# Patient Record
Sex: Male | Born: 1965 | Race: White | Hispanic: No | Marital: Married | Smoking: Former smoker
Health system: Southern US, Community
[De-identification: ages and names within clinical notes are randomized; demographics above are authoritative.]

## PROBLEM LIST (undated history)

## (undated) DIAGNOSIS — R0600 Dyspnea, unspecified: Secondary | ICD-10-CM

## (undated) DIAGNOSIS — E119 Type 2 diabetes mellitus without complications: Secondary | ICD-10-CM

## (undated) DIAGNOSIS — G4730 Sleep apnea, unspecified: Secondary | ICD-10-CM

## (undated) HISTORY — PX: CHOLECYSTECTOMY: SHX55

---

## 2005-11-22 ENCOUNTER — Ambulatory Visit: Payer: Self-pay | Admitting: Family Medicine

## 2006-03-28 ENCOUNTER — Ambulatory Visit: Payer: Self-pay | Admitting: Family Medicine

## 2006-08-14 ENCOUNTER — Ambulatory Visit: Payer: Self-pay | Admitting: Family Medicine

## 2006-08-15 ENCOUNTER — Ambulatory Visit: Payer: Self-pay | Admitting: Family Medicine

## 2006-08-20 ENCOUNTER — Ambulatory Visit: Payer: Self-pay | Admitting: Family Medicine

## 2006-09-05 ENCOUNTER — Encounter: Admission: RE | Admit: 2006-09-05 | Discharge: 2006-09-05 | Payer: Self-pay | Admitting: Surgery

## 2006-09-14 ENCOUNTER — Ambulatory Visit (HOSPITAL_COMMUNITY): Admission: RE | Admit: 2006-09-14 | Discharge: 2006-09-14 | Payer: Self-pay | Admitting: Surgery

## 2006-09-21 ENCOUNTER — Encounter: Admission: RE | Admit: 2006-09-21 | Discharge: 2006-09-21 | Payer: Self-pay | Admitting: Surgery

## 2007-06-17 ENCOUNTER — Ambulatory Visit: Payer: Self-pay | Admitting: Family Medicine

## 2007-06-17 DIAGNOSIS — R1031 Right lower quadrant pain: Secondary | ICD-10-CM

## 2007-06-18 ENCOUNTER — Encounter (HOSPITAL_COMMUNITY): Admission: RE | Admit: 2007-06-18 | Discharge: 2007-07-02 | Payer: Self-pay | Admitting: General Surgery

## 2007-06-21 ENCOUNTER — Encounter (INDEPENDENT_AMBULATORY_CARE_PROVIDER_SITE_OTHER): Payer: Self-pay | Admitting: General Surgery

## 2007-06-21 ENCOUNTER — Ambulatory Visit (HOSPITAL_COMMUNITY): Admission: RE | Admit: 2007-06-21 | Discharge: 2007-06-21 | Payer: Self-pay | Admitting: General Surgery

## 2009-11-11 ENCOUNTER — Telehealth: Payer: Self-pay | Admitting: Family Medicine

## 2010-11-01 NOTE — Progress Notes (Signed)
Summary: pt walked in  Phone Note Call from Patient   Caller: Patient Summary of Call: Pt walked in complaing of feeling dizzy and light headed since this morning.  He said he has had a flu like virus all week.  I checked with Dr Hetty Ely, he was not able to see him. No appt available with Dr Dayton Martes but offered to work pt in at 2:15.  He said that was too long to wait, he  and his wife left. Initial call taken by: Lowella Petties CMA,  November 11, 2009 9:16 AM  Follow-up for Phone Call        I could work him in at 10:30, 11:15 or11:30 Follow-up by: Ruthe Mannan MD,  November 11, 2009 9:27 AM  Additional Follow-up for Phone Call Additional follow up Details #1::        [facility] asking pt to call.                Lowella Petties CMA  November 11, 2009 9:32 AM

## 2011-02-14 NOTE — H&P (Signed)
NAMEDEWARREN, LEDBETTER              ACCOUNT NO.:  [id number]   MEDICAL RECORD NO.:  [id number]          PATIENT TYPE:  AMB   LOCATION:  DAY                           FACILITY:  [facility]   PHYSICIAN:  Dalia Heading, M.D.  DATE OF BIRTH:  01/21/1966   DATE OF ADMISSION:  DATE OF DISCHARGE:  LH                              HISTORY & PHYSICAL   CHIEF COMPLAINT:  Chronic cholecystitis.   HISTORY OF PRESENT ILLNESS:  The patient is a 45 year old white male who  is referred for evaluation and treatment of biliary colic secondary to  chronic cholecystitis.  He has been having right upper quadrant  abdominal pain, nausea, bloating for many years.  He does have fatty  food intolerance.  No fever, chills, jaundice have been noted.   PAST MEDICAL HISTORY:  Unremarkable.   PAST SURGICAL HISTORY:  Unremarkable.   CURRENT MEDICATIONS:  None.   ALLERGIES:  No known drug allergies.   REVIEW OF SYSTEMS:  Noncontributory.   PHYSICAL EXAMINATION:  GENERAL:  The patient is a well-developed, well-  nourished white male in no acute distress.  HEENT:  Reveals no scleral icterus.  LUNGS:  Clear to auscultation with equal breath sounds bilaterally.  HEART:  Reveals regular rate and rhythm without S3, S4, murmurs.  ABDOMEN:  Soft, nondistended.  He is tender in the right upper quadrant  to palpation.  No hepatosplenomegaly or hernias are identified.   Ultrasound of the gallbladder in the past has been negative for  cholelithiasis.  HIDA scan reveals chronic cholecystitis with a very low  gallbladder ejection fraction.   IMPRESSION:  Chronic cholecystitis.   PLAN:  The patient is scheduled for laparoscopic cholecystectomy on  June 21, 2007.  The risks and benefits of the procedure including  bleeding, infection, hepatobiliary injury and the possibility of an open  procedure were fully explained to the patient who gave informed consent.      Dalia Heading, M.D.  Electronically  Signed     MAJ/MEDQ  D:  06/18/2007  T:  06/19/2007  Job:  [id number]

## 2011-02-14 NOTE — Op Note (Signed)
NAMESAYRE, Gregory Johnston              ACCOUNT NO.:  [id number]   MEDICAL RECORD NO.:  [id number]          PATIENT TYPE:  AMB   LOCATION:  DAY                           FACILITY:  [facility]   PHYSICIAN:  Dalia Heading, M.D.  DATE OF BIRTH:  1966-02-02   DATE OF PROCEDURE:  06/21/2007  DATE OF DISCHARGE:                               OPERATIVE REPORT   PREOPERATIVE DIAGNOSIS:  Chronic cholecystitis.   POSTOPERATIVE DIAGNOSIS:  Chronic cholecystitis.   PROCEDURE:  Laparoscopic cholecystectomy.   SURGEON:  Dr. Franky Macho.   ASSISTANT:  Dr. Tilford Pillar.   ANESTHESIA:  General endotracheal.   INDICATIONS:  The patient is a 45 year old white male who presents with  biliary colic secondary to chronic cholecystitis.  Risks and benefits of  the procedure including bleeding, infection, hepatobiliary injury and  the possibility an open procedure were fully explained to the patient,  who gave informed consent.   PROCEDURE NOTE:  The patient was placed in the supine position.  After  induction of general endotracheal anesthesia, the abdomen was prepped  and draped using the usual sterile technique with Betadine.  Surgical  site confirmation was performed.   A supraumbilical incision was made down to the fascia.  A Veress needle  was introduced into the abdominal cavity, and confirmation of placement  was done using the saline drop test.  The abdomen was then insufflated  to 16 mmHg pressure.  An 11-mm trocar was introduced into the abdominal  cavity under direct visualization without difficulty.  The patient was  placed in reversed Trendelenburg position.  Additional 11-mm trocar was  placed in the epigastric region, and 5-mm trocars were placed in the  right upper quadrant and right flank regions.  Liver was inspected and  noted to normal limits.  The gallbladder was retracted superiorly and  laterally.  Dissection was begun around the infundibulum of the  gallbladder.  The cystic duct  was first identified.  Its juncture to the  infundibulum fully identified.  Endoclips were placed proximally and  distally on the cystic duct, and the cystic duct was divided.  This was  likewise done to the cystic artery.  The gallbladder then freed away  from the gallbladder fossa using Bovie electrocautery.  The gallbladder  delivered through the epigastric trocar site using an EndoCatch bag.  The gallbladder fossa was inspected, and no abnormal bleeding or bile  leakage was noted.  Surgicel was placed in the gallbladder fossa.  All  fluid and air were then evacuated from the abdominal cavity prior to  removal of the trocars.   All wounds were irrigated with normal saline.  All wounds were injected  with 0.5% Sensorcaine.  The supraumbilical fascia was reapproximated  using a 0 Vicryl interrupted suture.  All skin incisions were closed  using staples.  Betadine ointment and dry sterile dressings were  applied.   All tape and needle counts were correct at the end of the procedure.  The patient was extubated in the operating room and went back to  recovery room awake in stable condition.   COMPLICATIONS:  None.  SPECIMEN:  Gallbladder.   BLOOD LOSS:  Minimal.      Dalia Heading, M.D.  Electronically Signed     MAJ/MEDQ  D:  06/21/2007  T:  06/21/2007  Job:  [id number]

## 2011-02-17 NOTE — Letter (Signed)
August 15, 2006    [place]. Daphine Deutscher, MD  [address]  [place], [place] [address]   RE:  Gregory, Johnston  MRN:  [id number]  /  DOB:  1966-08-09   Dear [name]:   This is to introduce Gregory Johnston, a 45 year old white male who came to me  with right posterior flank pain that had been coming and going prior to  being seen and was fairly constant.  When I saw him, he denied fevers or  chills.  He thought that greasy food was bothering him significantly,  especially salad with Ranch dressing.  He cannot get comfortable when it  happens.  It usually lasts 3-4 hours, more constant in the last few weeks.  He denies nausea and vomiting.  5/10 when I saw him, 7/10 at the worse, and  0/10 when it was not there.  He does not see blood in his urine.  Ultrasound  was negative in April 2004.  I sent him for an ultrasound this time which  was positive only for signs consistent for cholecystitis without stones  being seen.  This is an oral report, only.  His metabolic profile to include  a metabolic panel, CBC, amylase, and lipase, were all normal.   Past medical history is also significant for diabetes since march 2007.  He  is on no medications.  He has controlled it well with diet and exercise  since diagnosis.   He has no known drug allergies and is, otherwise, reasonably healthy.   I appreciate your seeing him and will look forward to your evaluation.    Sincerely,      Arta Silence, MD  Electronically Signed    RNS/MedQ  DD: 08/15/2006  DT: 08/15/2006  Job #: [id number]

## 2011-07-13 LAB — BASIC METABOLIC PANEL
BUN: 20
Calcium: 9.2
Chloride: 104
Creatinine, Ser: 1.27
Glucose, Bld: 96
Sodium: 137

## 2011-07-13 LAB — CBC
Hemoglobin: 14.5
MCV: 87

## 2011-07-13 LAB — HEPATIC FUNCTION PANEL
AST: 25
Indirect Bilirubin: 0.8

## 2013-05-16 ENCOUNTER — Ambulatory Visit: Payer: Self-pay | Admitting: Unknown Physician Specialty

## 2013-05-19 LAB — PATHOLOGY REPORT

## 2014-03-16 DIAGNOSIS — K219 Gastro-esophageal reflux disease without esophagitis: Secondary | ICD-10-CM | POA: Insufficient documentation

## 2014-03-16 DIAGNOSIS — M25561 Pain in right knee: Secondary | ICD-10-CM | POA: Insufficient documentation

## 2014-03-16 DIAGNOSIS — E119 Type 2 diabetes mellitus without complications: Secondary | ICD-10-CM | POA: Insufficient documentation

## 2014-11-27 ENCOUNTER — Ambulatory Visit: Payer: Self-pay | Admitting: Specialist

## 2015-03-17 DIAGNOSIS — G4733 Obstructive sleep apnea (adult) (pediatric): Secondary | ICD-10-CM | POA: Insufficient documentation

## 2018-05-11 ENCOUNTER — Emergency Department: Payer: BLUE CROSS/BLUE SHIELD

## 2018-05-11 ENCOUNTER — Encounter: Payer: Self-pay | Admitting: Emergency Medicine

## 2018-05-11 ENCOUNTER — Emergency Department
Admission: EM | Admit: 2018-05-11 | Discharge: 2018-05-11 | Disposition: A | Discharge status: Home / Self Care | Payer: BLUE CROSS/BLUE SHIELD | Attending: Emergency Medicine | Admitting: Emergency Medicine

## 2018-05-11 ENCOUNTER — Other Ambulatory Visit: Payer: Self-pay

## 2018-05-11 DIAGNOSIS — W458XXA Other foreign body or object entering through skin, initial encounter: Secondary | ICD-10-CM | POA: Diagnosis not present

## 2018-05-11 DIAGNOSIS — S80852A Superficial foreign body, left lower leg, initial encounter: Secondary | ICD-10-CM

## 2018-05-11 DIAGNOSIS — Z23 Encounter for immunization: Secondary | ICD-10-CM | POA: Insufficient documentation

## 2018-05-11 DIAGNOSIS — Y999 Unspecified external cause status: Secondary | ICD-10-CM | POA: Diagnosis not present

## 2018-05-11 DIAGNOSIS — S81842A Puncture wound with foreign body, left lower leg, initial encounter: Secondary | ICD-10-CM | POA: Insufficient documentation

## 2018-05-11 DIAGNOSIS — Z87891 Personal history of nicotine dependence: Secondary | ICD-10-CM | POA: Diagnosis not present

## 2018-05-11 DIAGNOSIS — Y93H2 Activity, gardening and landscaping: Secondary | ICD-10-CM | POA: Diagnosis not present

## 2018-05-11 DIAGNOSIS — Y92017 Garden or yard in single-family (private) house as the place of occurrence of the external cause: Secondary | ICD-10-CM | POA: Insufficient documentation

## 2018-05-11 MED ORDER — IBUPROFEN 600 MG PO TABS: 600.0000 mg | ORAL_TABLET | Freq: Three times a day (TID) | ORAL | 0 refills | Status: DC | PRN

## 2018-05-11 MED ORDER — CLINDAMYCIN PHOSPHATE 600 MG/50ML IV SOLN
600.0000 mg | Freq: Once | INTRAVENOUS | Status: AC
  Administered 2018-05-11: 600 mg via INTRAVENOUS
  Filled 2018-05-11: qty 50

## 2018-05-11 MED ORDER — CLINDAMYCIN HCL 300 MG PO CAPS: 300.0000 mg | ORAL_CAPSULE | Freq: Three times a day (TID) | ORAL | 0 refills | Status: AC

## 2018-05-11 MED ORDER — TETANUS-DIPHTH-ACELL PERTUSSIS 5-2.5-18.5 LF-MCG/0.5 IM SUSP
0.5000 mL | Freq: Once | INTRAMUSCULAR | Status: AC
  Administered 2018-05-11: 0.5 mL via INTRAMUSCULAR
  Filled 2018-05-11: qty 0.5

## 2018-05-11 MED ORDER — TRAMADOL HCL 50 MG PO TABS: 50.0000 mg | ORAL_TABLET | Freq: Two times a day (BID) | ORAL | 0 refills | Status: DC | PRN

## 2018-05-11 MED ORDER — TETANUS-DIPHTH-ACELL PERTUSSIS 5-2.5-18.5 LF-MCG/0.5 IM SUSP
INTRAMUSCULAR | Status: AC
  Filled 2018-05-11: qty 0.5

## 2018-05-11 MED ORDER — LIDOCAINE HCL (PF) 1 % IJ SOLN
INTRAMUSCULAR | Status: AC
  Filled 2018-05-11: qty 5

## 2018-05-11 NOTE — Consult Note (Addendum)
Subjective:   CC: FB in left leg  HPI:  Gregory Johnston is a 52 y.o. male who was consulted by Nona Dellronald smith for evaluation of  FB in left leg. First noted last night while mowing yard.  Lawnmower hit an object that flew into his leg .  Symptoms include: Pain is sharp, non-radiating.  Exacerbated by foot movement.  Alleviated by nothing specific.  Associated with open wound that is not actively bleeding.     Past Medical History: nothing pertinent Past Surgical History: nothing pertinent  Family History: nothing pertinent  Social History:  reports that he has quit smoking. He does not have any smokeless tobacco history on file. His alcohol and drug histories are not on file.  Current Medications: none reported  Allergies:  No Known Allergies  ROS:  A 15 point review of systems was performed and pertinent positives and negatives noted in HPI   Objective:     BP 138/76 (BP Location: Right Arm)   Pulse 78   Temp 98.6 F (37 C) (Oral)   Resp 18   Ht 5\' 9"  (1.753 m)   Wt 101.2 kg   SpO2 100%   BMI 32.93 kg/m   Constitutional :  alert, cooperative, appears stated age and no distress  Lymphatics/Throat:  no asymmetry, masses, or scars  Respiratory:  clear to auscultation bilaterally  Cardiovascular:  regular rate and rhythm  Gastrointestinal: soft, non-tender; bowel sounds normal; no masses,  no organomegaly.  Musculoskeletal: Steady gait and movement, Full ROM of left foot, neural sensation intact  Skin: Cool and moist, stellate puncture wound noted on left lateral aspect middle of lower leg.  No active bleeding noted.  S/p irrigation by ED PA and anesthesized.  No obvious drainage.  Psychiatric: Normal affect, non-agitated, not confused       LABS:  CMP 06/19/2007  Glucose 96  BUN 20  Creatinine 1.27  Sodium 137  Potassium 4.4  Chloride 104  CO2 28  Calcium 9.2  Total Protein 7.2  Total Bilirubin 0.9  Alkaline Phos 57  AST 25  ALT 32   CBC 06/19/2007  WBC 6.7   Hemoglobin 14.5  Hematocrit 41.9  Platelets 237    RADS: CLINICAL DATA:  Left lower leg laceration, possibly from barbed wire.  EXAM: LEFT TIBIA AND FIBULA - 2 VIEW  COMPARISON:  None.  FINDINGS: There is a coiled, curvilinear metallic foreign body in the lateral soft tissues of the mid left lower leg with overlying bandage material and soft tissue irregularity. No fracture is seen.  IMPRESSION: Broken piece of barbed wire in the lateral soft tissues of the mid left lower leg without fracture.   Electronically Signed   By: Beckie SaltsSteven  Reid M.D.  CLINICAL DATA:  Foreign body removal left lower lateral leg, evaluate for residual  EXAM: LEFT TIBIA AND FIBULA - 2 VIEW  COMPARISON:  None.  FINDINGS: Soft tissue laceration along the lateral aspect of the mid fibula. No radiopaque foreign body is seen.  No fracture or dislocation is seen.  The joint spaces are preserved.  IMPRESSION: Soft tissue laceration along the lateral aspect of the mid fibula. No radiopaque foreign body is seen.   Electronically Signed   By: Charline BillsSriyesh  Krishnan M.D.   On: 05/11/2018 15:53   Assessment:     FB in left leg, piece of barbed wire   Plan:     1. Wound gently probed with hemostat and the FB was palpable about 1.5cm deep and  to the anterior portion of open wound in muscle.  Needle grasper used to gently pull FB out of wound with some tension.  Wound itself without any bleeding.  Packed with betadine infused 4x4 and wrapped.  Wound check about later confirmed no active bleed, with no increasing pain, neuromotor and sensory intact and unchanged from prior exam.  Post removal xray also did not show any residual FB.  Pt will go home on abx and f/u in office two days for wound check. Pain meds per ED  - notified patient to return ED  for sudden increase in pain, discharge, and/or accompanying fever.  Go to nearest urgent care/ED for afterhour care.  ED provider  updated and agrees with plan

## 2018-05-11 NOTE — ED Notes (Signed)
At 1700  Pt returned to tx area  Stating his dressing had slipped  Down Gregory Johnston checked patient packing wick replaced   new dsd applied

## 2018-05-11 NOTE — ED Triage Notes (Signed)
Possible piece of barbed wire L lower leg, received last night, [facility].

## 2018-05-11 NOTE — ED Notes (Signed)
LAST FOOD 1  PIECE  PIZZA AND GLASS WATER 4  HOURS  AGO

## 2018-05-11 NOTE — ED Notes (Signed)
TDAP  Given  R  deltiod

## 2018-05-11 NOTE — ED Provider Notes (Signed)
[facility] Emergency Department Provider Note   ____________________________________________   First MD Initiated Contact with Patient 05/11/18 1418     (approximate)  I have reviewed the triage vital signs and the nursing notes.   HISTORY  Chief Complaint Foreign Body in Skin    HPI Gregory Johnston ReasonsGerald W Mecca is a 52 y.o. male patient sent over from urgent care clinic for foreign body left lateral leg.  Patient said he was mowing last night when his lawnmower got tangled with some barb wire.  Patient state he felt a sharp puncture sensation to the lower leg.  Patient state there was moderate bleeding which was controlled with direct pressure.  Patient state denies seek medical care to this afternoon.  Patient denies loss of sensation or loss of function of the leg.  X-ray was positive for foreign body and patient sent to the ED for removal.  History reviewed. No pertinent past medical history.  Patient Active Problem List   Diagnosis Date Noted  . RLQ PAIN 06/17/2007    History reviewed. No pertinent surgical history.  Prior to Admission medications   Medication Sig Start Date End Date Taking? Authorizing Provider  glimepiride (AMARYL) 1 MG tablet Take 1 mg by mouth daily with breakfast.   Yes [provider]  metFORMIN (GLUCOPHAGE) 1000 MG tablet Take 1,000 mg by mouth 2 (two) times daily with a meal.   Yes [provider]  omeprazole (PRILOSEC) 20 MG capsule Take 20 mg by mouth daily.   Yes [provider]    Allergies Patient has no known allergies.  No family history on file.  Social History Social History   Tobacco Use  . Smoking status: Former Smoker  Substance Use Topics  . Alcohol use: Not on file  . Drug use: Not on file    Review of Systems Constitutional: No fever/chills Eyes: No visual changes. ENT: No sore throat. Cardiovascular: Denies chest pain. Respiratory: Denies shortness of  breath. Gastrointestinal: No abdominal pain.  No nausea, no vomiting.  No diarrhea.  No constipation. Genitourinary: Negative for dysuria. Musculoskeletal: Negative for back pain. Skin: Negative for rash. Neurological: Negative for headaches, focal weakness or numbness. Endocrine:Diabetes  ____________________________________________   PHYSICAL EXAM:  VITAL SIGNS: ED Triage Vitals [05/11/18 1346]  Enc Vitals Group     BP 135/79     Pulse Rate 87     Resp 20     Temp 98.8 F (37.1 C)     Temp Source Oral     SpO2 97 %     Weight 223 lb (101.2 kg)     Height 5\' 9"  (1.753 m)     Head Circumference      Peak Flow      Pain Score 7     Pain Loc      Pain Edu?      Excl. in GC?    Constitutional: Alert and oriented. Well appearing and in no acute distress. Eyes: Conjunctivae are normal. PERRL. EOMI. Head: Atraumatic. Nose: No congestion/rhinnorhea. Mouth/Throat: Mucous membranes are moist.  Oropharynx non-erythematous. Neck: No stridor. No cervical spine tenderness to palpation. Hematological/Lymphatic/Immunilogical No cervical lymphadenopathy. Cardiovascular: Normal rate, regular rhythm. Grossly normal heart sounds.  Good peripheral circulation. Respiratory: Normal respiratory effort.  No retractions. Lungs CTAB. Gastrointestinal: Soft and nontender. No Musculoskeletal: No lower extremity tenderness nor edema.  No joint effusions. Neurologic:  Normal speech and language. No gross focal neurologic deficits are appreciated. No gait instability. Skin: 1 cm  puncture wound of nonpalpable foreign body.   Psychiatric: Mood and affect are normal. Speech and behavior are normal.  ____________________________________________   LABS (all labs ordered are listed, but only abnormal results are displayed)  Labs Reviewed  BASIC METABOLIC PANEL  CBC WITH DIFFERENTIAL/PLATELET    ____________________________________________  EKG   ____________________________________________  RADIOLOGY  ED MD interpretation:    Official radiology report(s): Dg Tibia/fibula Left  Result Date: 05/11/2018 CLINICAL DATA:  Left lower leg laceration, possibly from barbed wire. EXAM: LEFT TIBIA AND FIBULA - 2 VIEW COMPARISON:  None. FINDINGS: There is a coiled, curvilinear metallic foreign body in the lateral soft tissues of the mid left lower leg with overlying bandage material and soft tissue irregularity. No fracture is seen. IMPRESSION: Broken piece of barbed wire in the lateral soft tissues of the mid left lower leg without fracture. Electronically Signed   By: Beckie Salts M.D.   On: 05/11/2018 15:22    ____________________________________________   PROCEDURES  Procedure(s) performed: None  Procedures  Critical Care performed: No  ____________________________________________   INITIAL IMPRESSION / ASSESSMENT AND PLAN / ED COURSE  As part of my medical decision making, I reviewed the following data within the electronic MEDICAL RECORD NUMBER    Puncture and foreign body to the left lower leg.  Patient was consulted Doctor Tonna Boehringer, on-call surgeon, who came to evaluate the patient and remove the foreign body.  Patient given IV antibiotics.  Patient was given tetanus booster.  Patient given discharge care instruction and advised take medication as directed.  Discussed with patient rationale for not closing the wound secondary to time of injury.  Patient advised follow-up surgical department in 2 days.Return to ED if condition worsens.     ____________________________________________   FINAL CLINICAL IMPRESSION(S) / ED DIAGNOSES  Final diagnoses:  None     ED Discharge Orders    None       Note:  This document was prepared using Dragon voice recognition software and may include unintentional dictation errors.    Joni Reining, PA-C 05/11/18 1610     Sharman Cheek, MD 05/27/18 1425

## 2018-05-11 NOTE — Discharge Instructions (Addendum)
Follow discharge care instruction take medication as directed.  Call Monday morning at 830 to schedule follow-up appointment for wound check.

## 2018-05-11 NOTE — ED Notes (Signed)
Pt [facility] for   Fb l lower leg  Had x rays done   was cutting grass last pm and  Felt  Something strike his l lower leg  Noticed bleeding

## 2018-05-16 ENCOUNTER — Emergency Department
Admission: EM | Admit: 2018-05-16 | Discharge: 2018-05-16 | Disposition: A | Discharge status: Home / Self Care | Payer: BLUE CROSS/BLUE SHIELD | Attending: Emergency Medicine | Admitting: Emergency Medicine

## 2018-05-16 ENCOUNTER — Emergency Department: Payer: BLUE CROSS/BLUE SHIELD

## 2018-05-16 ENCOUNTER — Other Ambulatory Visit: Payer: Self-pay

## 2018-05-16 DIAGNOSIS — W268XXD Contact with other sharp object(s), not elsewhere classified, subsequent encounter: Secondary | ICD-10-CM | POA: Insufficient documentation

## 2018-05-16 DIAGNOSIS — E119 Type 2 diabetes mellitus without complications: Secondary | ICD-10-CM | POA: Diagnosis not present

## 2018-05-16 DIAGNOSIS — Z87891 Personal history of nicotine dependence: Secondary | ICD-10-CM | POA: Insufficient documentation

## 2018-05-16 DIAGNOSIS — Z7984 Long term (current) use of oral hypoglycemic drugs: Secondary | ICD-10-CM | POA: Insufficient documentation

## 2018-05-16 DIAGNOSIS — Z4889 Encounter for other specified surgical aftercare: Secondary | ICD-10-CM | POA: Diagnosis present

## 2018-05-16 DIAGNOSIS — Z79899 Other long term (current) drug therapy: Secondary | ICD-10-CM | POA: Diagnosis not present

## 2018-05-16 DIAGNOSIS — S81802D Unspecified open wound, left lower leg, subsequent encounter: Secondary | ICD-10-CM | POA: Diagnosis not present

## 2018-05-16 HISTORY — DX: Sleep apnea, unspecified: G47.30

## 2018-05-16 HISTORY — DX: Type 2 diabetes mellitus without complications: E11.9

## 2018-05-16 LAB — CBC WITH DIFFERENTIAL/PLATELET
Basophils Absolute: 0 10*3/uL (ref 0–0.1)
Basophils Relative: 1 %
Eosinophils Absolute: 0.2 10*3/uL (ref 0–0.7)
Eosinophils Relative: 3 %
HCT: 37.5 % — ABNORMAL LOW (ref 40.0–52.0)
HEMOGLOBIN: 13.3 g/dL (ref 13.0–18.0)
LYMPHS ABS: 1.2 10*3/uL (ref 1.0–3.6)
LYMPHS PCT: 23 %
MCH: 30.7 pg (ref 26.0–34.0)
MCHC: 35.4 g/dL (ref 32.0–36.0)
MCV: 86.8 fL (ref 80.0–100.0)
MONOS PCT: 6 %
Monocytes Absolute: 0.3 10*3/uL (ref 0.2–1.0)
NEUTROS PCT: 67 %
Neutro Abs: 3.4 10*3/uL (ref 1.4–6.5)
Platelets: 234 10*3/uL (ref 150–440)
RBC: 4.31 MIL/uL — AB (ref 4.40–5.90)
RDW: 13.3 % (ref 11.5–14.5)
WBC: 5.2 10*3/uL (ref 3.8–10.6)

## 2018-05-16 LAB — SEDIMENTATION RATE: Sed Rate: 43 mm/hr — ABNORMAL HIGH (ref 0–20)

## 2018-05-16 LAB — COMPREHENSIVE METABOLIC PANEL
ALK PHOS: 60 U/L (ref 38–126)
ALT: 22 U/L (ref 0–44)
ANION GAP: 8 (ref 5–15)
AST: 21 U/L (ref 15–41)
Albumin: 4.5 g/dL (ref 3.5–5.0)
BILIRUBIN TOTAL: 0.6 mg/dL (ref 0.3–1.2)
BUN: 20 mg/dL (ref 6–20)
CALCIUM: 9.4 mg/dL (ref 8.9–10.3)
CO2: 24 mmol/L (ref 22–32)
CREATININE: 1.07 mg/dL (ref 0.61–1.24)
Chloride: 106 mmol/L (ref 98–111)
GFR calc non Af Amer: >60 mL/min (ref >60)
Glucose, Bld: 124 mg/dL — ABNORMAL HIGH (ref 70–99)
Potassium: 4.1 mmol/L (ref 3.5–5.1)
Sodium: 138 mmol/L (ref 135–145)
TOTAL PROTEIN: 7.9 g/dL (ref 6.5–8.1)

## 2018-05-16 MED ORDER — IOPAMIDOL (ISOVUE-300) INJECTION 61%
100.0000 mL | Freq: Once | INTRAVENOUS | Status: AC | PRN
  Administered 2018-05-16: 100 mL via INTRAVENOUS
  Filled 2018-05-16: qty 100

## 2018-05-16 NOTE — ED Notes (Signed)
Patient transported to CT 

## 2018-05-16 NOTE — ED Provider Notes (Signed)
[facility] Emergency Department Provider Note  ____________________________________________  Time seen: Approximately 3:01 PM  I have reviewed the triage vital signs and the nursing notes.   HISTORY  Chief Complaint Wound Infection   HPI Gregory Johnston ReasonsGerald W Johnston is a 52 y.o. male with a history of diabetes on metformin who presents for a wound check.  Patient had a barb wire stuck in his leg which was removed by Dr. Tonna BoehringerSakai in the ED 5 days ago.  Patient saw him again in the office 3 days ago and the wound seemed to be healing well.  Patient reports that he has noted mild worsening swelling and redness around the wound since yesterday.  No fever or chills, no nausea or vomiting.  He called the surgeon this morning who recommended that he came to the emergency room to rule out an abscess or cellulitis.  He continues to be on clindamycin.  He reports that the pain has been improving since the accident and is minimal.  Past Medical History:  Diagnosis Date  . Diabetes mellitus without complication (HCC)   . Sleep apnea     Patient Active Problem List   Diagnosis Date Noted  . RLQ PAIN 06/17/2007    Past Surgical History:  Procedure Laterality Date  . CHOLECYSTECTOMY      Prior to Admission medications   Medication Sig Start Date End Date Taking? Authorizing Provider  clindamycin (CLEOCIN) 300 MG capsule Take 1 capsule (300 mg total) by mouth 3 (three) times daily for 10 days. 05/11/18 05/21/18  Joni ReiningSmith, Ronald K, PA-C  glimepiride (AMARYL) 1 MG tablet Take 1 mg by mouth daily with breakfast.    [provider]  ibuprofen (ADVIL,MOTRIN) 600 MG tablet Take 1 tablet (600 mg total) by mouth every 8 (eight) hours as needed. 05/11/18   Joni ReiningSmith, Ronald K, PA-C  metFORMIN (GLUCOPHAGE) 1000 MG tablet Take 1,000 mg by mouth 2 (two) times daily with a meal.    [provider]  omeprazole (PRILOSEC) 20 MG capsule Take 20 mg by mouth daily.    [provider]  traMADol (ULTRAM) 50 MG tablet Take 1 tablet (50 mg total) by mouth every 12 (twelve) hours as needed. 05/11/18   Joni ReiningSmith, Ronald K, PA-C    Allergies Patient has no known allergies.  No family history on file.  Social History Social History   Tobacco Use  . Smoking status: Former Games developermoker  . Smokeless tobacco: Never Used  Substance Use Topics  . Alcohol use: Yes  . Drug use: Not Currently    Review of Systems  Constitutional: Negative for fever. Eyes: Negative for visual changes. ENT: Negative for sore throat. Neck: No neck pain  Cardiovascular: Negative for chest pain. Respiratory: Negative for shortness of breath. Gastrointestinal: Negative for abdominal pain, vomiting or diarrhea. Genitourinary: Negative for dysuria. Musculoskeletal: Negative for back pain.  Skin: Negative for rash. + LLE wound Neurological: Negative for headaches, weakness or numbness. Psych: No SI or HI  ____________________________________________   PHYSICAL EXAM:  VITAL SIGNS: ED Triage Vitals  Enc Vitals Group     BP 05/16/18 1257 (!) 155/96     Pulse Rate 05/16/18 1257 65     Resp 05/16/18 1257 17     Temp 05/16/18 1257 98.3 F (36.8 C)     Temp Source 05/16/18 1257 Oral     SpO2 05/16/18 1257 96 %     Weight 05/16/18 1258 222 lb 10.6 oz (101 kg)  Height 05/16/18 1258 5\' 9"  (1.753 m)     Head Circumference --      Peak Flow --      Pain Score 05/16/18 1258 0     Pain Loc --      Pain Edu? --      Excl. in GC? --     Constitutional: Alert and oriented. Well appearing and in no apparent distress. HEENT:      Head: Normocephalic and atraumatic.         Eyes: Conjunctivae are normal. Sclera is non-icteric.       Mouth/Throat: Mucous membranes are moist.       Neck: Supple with no signs of meningismus. Cardiovascular: Regular rate and rhythm. No murmurs, gallops, or rubs. 2+ symmetrical distal pulses are present in all extremities. No JVD. Respiratory: Normal respiratory  effort. Lungs are clear to auscultation bilaterally. No wheezes, crackles, or rhonchi.  Musculoskeletal: There is an open wound located on the lateral aspect of the LLE with small amount of induration around it, no pus, no crepitus, no fluctuance palpable, no warmth or erythema. Neurologic: Normal speech and language. Face is symmetric. Moving all extremities. No gross focal neurologic deficits are appreciated. Skin: Skin is warm, dry and intact. No rash noted. Psychiatric: Mood and affect are normal. Speech and behavior are normal.  ____________________________________________   LABS (all labs ordered are listed, but only abnormal results are displayed)  Labs Reviewed  CBC WITH DIFFERENTIAL/PLATELET - Abnormal; Notable for the following components:      Result Value   RBC 4.31 (*)    HCT 37.5 (*)    All other components within normal limits  COMPREHENSIVE METABOLIC PANEL - Abnormal; Notable for the following components:   Glucose, Bld 124 (*)    All other components within normal limits  SEDIMENTATION RATE - Abnormal; Notable for the following components:   Sed Rate 43 (*)    All other components within normal limits   ____________________________________________  EKG  none  ____________________________________________  RADIOLOGY  I have personally reviewed the images performed during this visit and I agree with the Radiologist's read.   Interpretation by Radiologist:  Ct Tibia Fibula Left W Contrast  Result Date: 05/16/2018 CLINICAL DATA:  Open wound related to barbed wire injury. Pain, swelling and draining wound. EXAM: CT OF THE LOWER RIGHT EXTREMITY WITH CONTRAST TECHNIQUE: Multidetector CT imaging of the lower right extremity was performed according to the standard protocol following intravenous contrast administration. COMPARISON:  Radiographs 05/11/2018 CONTRAST:  100mL ISOVUE-300 IOPAMIDOL (ISOVUE-300) INJECTION 61% FINDINGS: There is an open wound involving the  anterior and lateral aspect of the mid calf. There is diffuse skin thickening and subcutaneous soft tissue swelling/edema/fluid consistent with cellulitis. I do not see an obvious discrete rim enhancing fluid collection to suggest a drainable abscess. Suspect mild associated myofasciitis involving the anterior compartment but no evidence of pyomyositis. No CT findings suspicious for septic arthritis or osteomyelitis. There is a small in knee joint effusion and there are moderate degenerative changes at the knee and ankle. Large subchondral cysts involving the lateral talus. IMPRESSION: 1. Open wound along the anterolateral aspect of the mid calf without discrete underlying soft tissue abscess. 2. No CT findings to suggest septic arthritis or osteomyelitis. 3. If symptoms do not improve or worsen MRI is suggested for further evaluation. Electronically Signed   By: Rudie MeyerP.  Gallerani M.D.   On: 05/16/2018 14:31     ____________________________________________   PROCEDURES  Procedure(s) performed: None  Procedures Critical Care performed:  None ____________________________________________   INITIAL IMPRESSION / ASSESSMENT AND PLAN / ED COURSE  52 y.o. male with a history of diabetes on metformin who presents for a wound check.  No systemic symptoms, normal vital signs, CT of the leg was done which shows no evidence of deep abscess, no foreign body.  Exam looks well healing with no pus, no crepitus, no signs of cellulitis.  Patient's surgeon Dr. Tonna Boehringer came to the ED to evaluate patient and agrees the wound is improving. Patient to continue clinda and f/u in 1 week at Dr. Geoffery Lyons office.  Discussed return precautions for any signs of cellulitis, fever, or purulent discharge.      As part of my medical decision making, I reviewed the following data within the electronic MEDICAL RECORD NUMBER Nursing notes reviewed and incorporated, Labs reviewed , Old chart reviewed, Radiograph reviewed , A consult was requested  and obtained from this/these consultant(s) Surgery, Notes from prior ED visits and [place] Controlled Substance Database    Pertinent labs & imaging results that were available during my care of the patient were reviewed by me and considered in my medical decision making (see chart for details).    ____________________________________________   FINAL CLINICAL IMPRESSION(S) / ED DIAGNOSES  Final diagnoses:  Wound of left leg, subsequent encounter      NEW MEDICATIONS STARTED DURING THIS VISIT:  ED Discharge Orders    None       Note:  This document was prepared using Dragon voice recognition software and may include unintentional dictation errors.    Don Perking, [place], MD 05/16/18 [phone]

## 2018-05-16 NOTE — ED Triage Notes (Signed)
[facility] a would to the LLE, states last Friday he was mowing and a bobwire fence got in the mower and slung it into his leg and a surgeon had to remove it. States he was given IV abx and a rx that he has been taking, states the wound is red and inflamed with drainage. States he has been changing the packing and dressing BID.Marland Kitchen. States last night he broke out into a sweat.

## 2018-05-16 NOTE — Discharge Instructions (Addendum)
Follow-up with Dr. Tonna BoehringerSakai in 1 week.  Continue to take clindamycin.  Return to the emergency room for pus, redness or warmth of the leg, pain, fever, nausea or vomiting.

## 2018-05-20 NOTE — Consult Note (Addendum)
Subjective:   CC: FB in left leg  HPI:  Gregory Johnston is a 52 y.o. male who was consulted by Lovina Reachevetta McClain for evaluation of  Left leg wound. First noted increased swelling and erythema to previously known left leg wound.  Denies any discharge or increase in pain, but called our office to state it is increasing red and swelling, especially in distal aspect, so recommended ED visit for further eval.  At time of exam, all workup did not indicate any worsening infection and patient resting comfortably on stretcher.   Past Medical History: nothing pertinent Past Surgical History: nothing pertinent  Family History: nothing pertinent  Social History:  reports that he has quit smoking. He has never used smokeless tobacco. He reports that he drinks alcohol. He reports that he has current or past drug history.  Current Medications: none reported  Allergies:  No Known Allergies  ROS:  A 15 point review of systems was performed and pertinent positives and negatives noted in HPI   Objective:     BP 131/79 (BP Location: Left Arm)   Pulse (!) 59   Temp 98.3 F (36.8 C) (Oral)   Resp 18   Ht 5\' 9"  (1.753 m)   Wt 101 kg   SpO2 98%   BMI 32.88 kg/m   Constitutional :  alert, cooperative, appears stated age and no distress  Lymphatics/Throat:  no asymmetry, masses, or scars  Respiratory:  clear to auscultation bilaterally  Cardiovascular:  regular rate and rhythm  Gastrointestinal: soft, non-tender; bowel sounds normal; no masses,  no organomegaly.  Musculoskeletal: Steady gait and movement, Full ROM of left foot, neural sensation intact  Skin: Cool and moist, stellate puncture wound noted on left lateral aspect middle of lower leg.  No active bleeding noted.  No drainage.  Degree of erthema and swelling unchanged from previous office visit three days ago  Psychiatric: Normal affect, non-agitated, not confused       LABS:  CMP Latest Ref Rng & Units 05/16/2018 06/19/2007  Glucose 70  - 99 mg/dL 161(W124(H) 96  BUN 6 - 20 mg/dL 20 20  Creatinine 9.600.61 - 1.24 mg/dL 4.541.07 0.981.27  Sodium 119135 - 145 mmol/L 138 137  Potassium 3.5 - 5.1 mmol/L 4.1 4.4  Chloride 98 - 111 mmol/L 106 104  CO2 22 - 32 mmol/L 24 28  Calcium 8.9 - 10.3 mg/dL 9.4 9.2  Total Protein 6.5 - 8.1 g/dL 7.9 7.2  Total Bilirubin 0.3 - 1.2 mg/dL 0.6 0.9  Alkaline Phos 38 - 126 U/L 60 57  AST 15 - 41 U/L 21 25  ALT 0 - 44 U/L 22 32   CBC Latest Ref Rng & Units 05/16/2018 06/19/2007  WBC 3.8 - 10.6 K/uL 5.2 6.7  Hemoglobin 13.0 - 18.0 g/dL 14.713.3 82.914.5  Hematocrit 56.240.0 - 52.0 % 37.5(L) 41.9  Platelets 150 - 440 K/uL 234 237    RADS: CLINICAL DATA:  Open wound related to barbed wire injury. Pain, swelling and draining wound.  EXAM: CT OF THE LOWER RIGHT EXTREMITY WITH CONTRAST  TECHNIQUE: Multidetector CT imaging of the lower right extremity was performed according to the standard protocol following intravenous contrast administration.  COMPARISON:  Radiographs 05/11/2018  CONTRAST:  100mL ISOVUE-300 IOPAMIDOL (ISOVUE-300) INJECTION 61%  FINDINGS: There is an open wound involving the anterior and lateral aspect of the mid calf. There is diffuse skin thickening and subcutaneous soft tissue swelling/edema/fluid consistent with cellulitis. I do not see an obvious discrete rim enhancing  fluid collection to suggest a drainable abscess.  Suspect mild associated myofasciitis involving the anterior compartment but no evidence of pyomyositis.  No CT findings suspicious for septic arthritis or osteomyelitis. There is a small in knee joint effusion and there are moderate degenerative changes at the knee and ankle. Large subchondral cysts involving the lateral talus.  IMPRESSION: 1. Open wound along the anterolateral aspect of the mid calf without discrete underlying soft tissue abscess. 2. No CT findings to suggest septic arthritis or osteomyelitis. 3. If symptoms do not improve or worsen MRI is  suggested for further evaluation.   Electronically Signed   By: Rudie MeyerP.  Gallerani M.D.   On: 05/16/2018 14:31   Assessment:     FB in left leg, piece of barbed wire s/p removal, now here for concern for worsening infection.   Plan:     1. Wound essentially unchanged from previous exam, no indication of worsening infection, cellulitis, or abscess formation.  Wound repacked with sterile 4x4 and secured in place with kerlex roll, not tightly wrapped.  Reassured patient and recommended continuing antibiotics and to f/u in office one week on outpt basis for wound check.  He verbalized understanding

## 2019-10-18 IMAGING — CR DG TIBIA/FIBULA 2V*L*
1 series · 4 of 4 positions shown · non-contrast
Comparison: None.

CLINICAL DATA: Left lower leg laceration, possibly from barbed
wire.

EXAM:
LEFT TIBIA AND FIBULA - 2 VIEW

[Series 1: dg tibia/fibula left · 0.14mm/px · 4 of 4 slices shown]
[im 1/4]
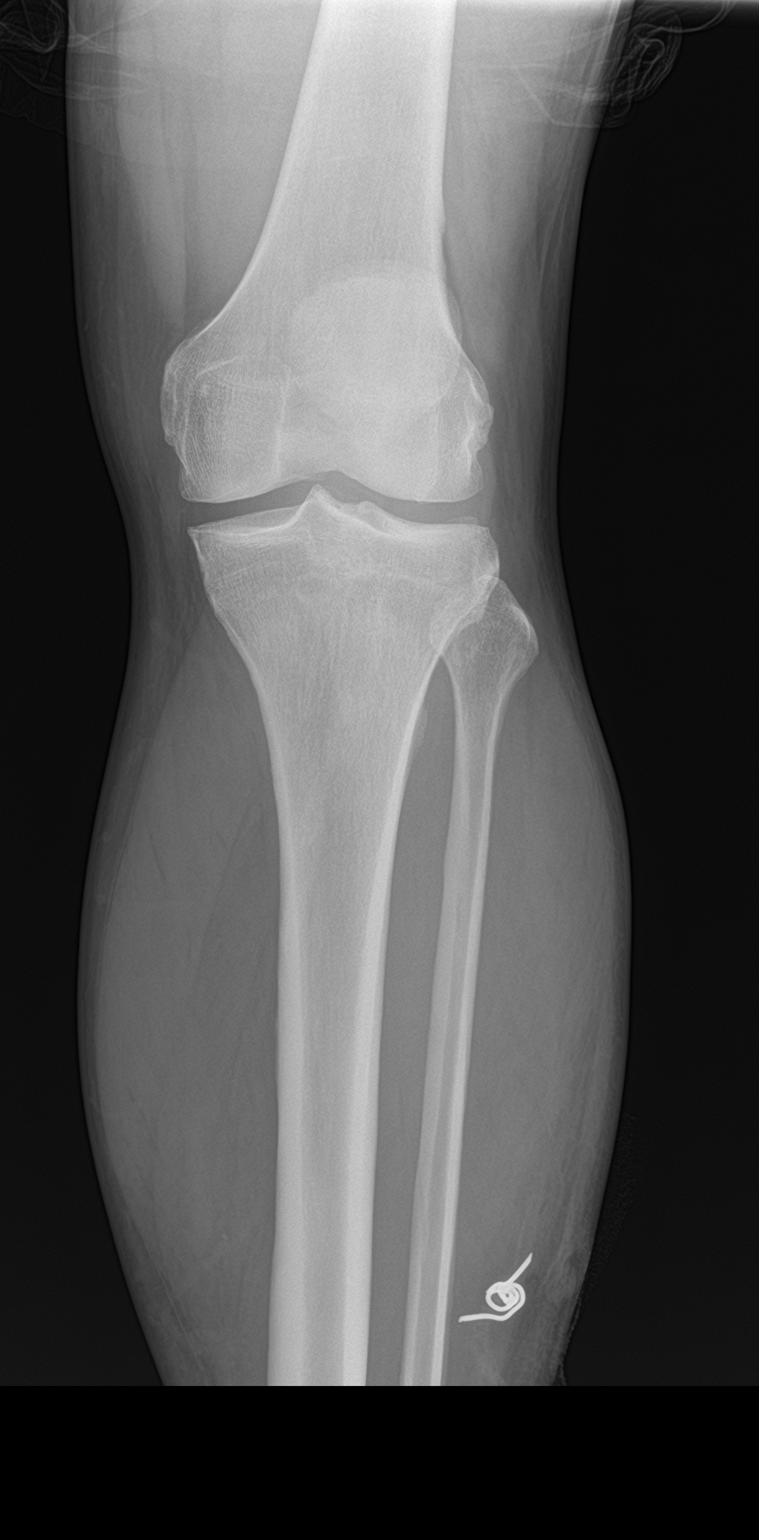
[im 2/4]
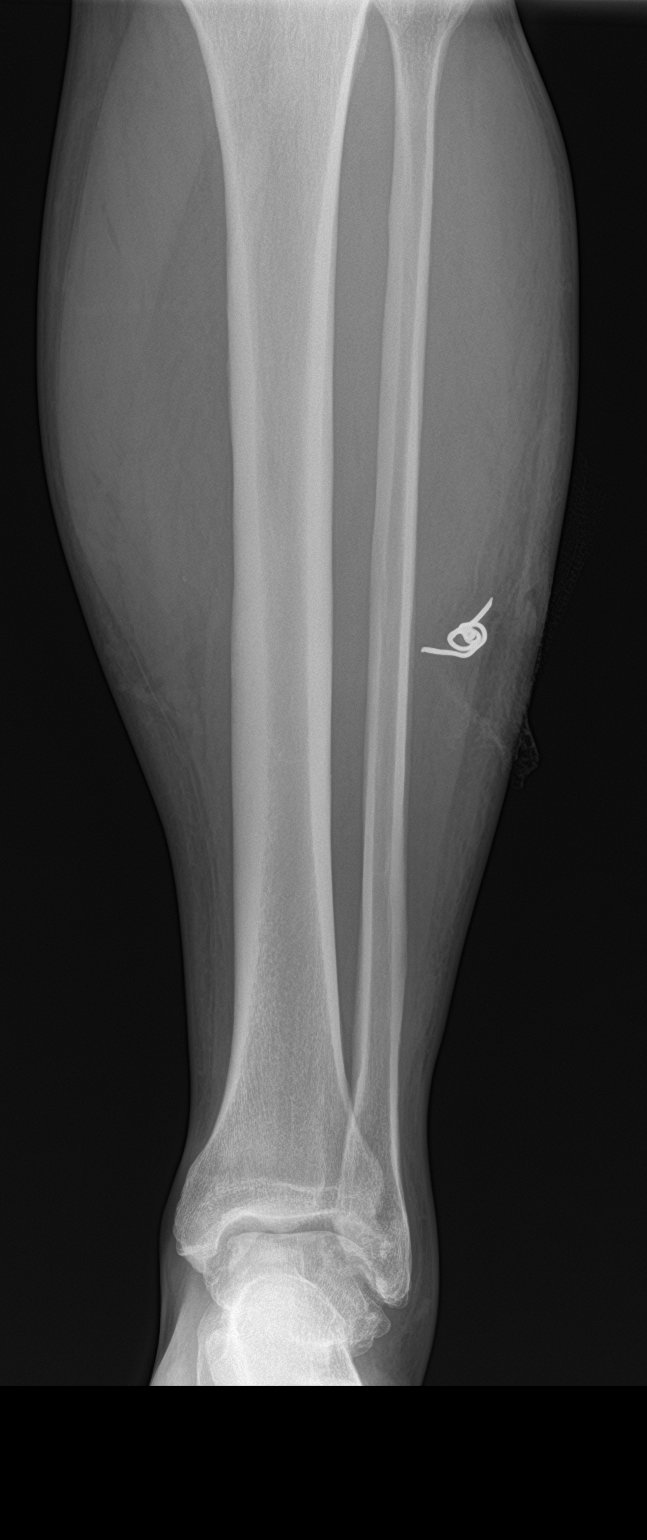
[im 3/4]
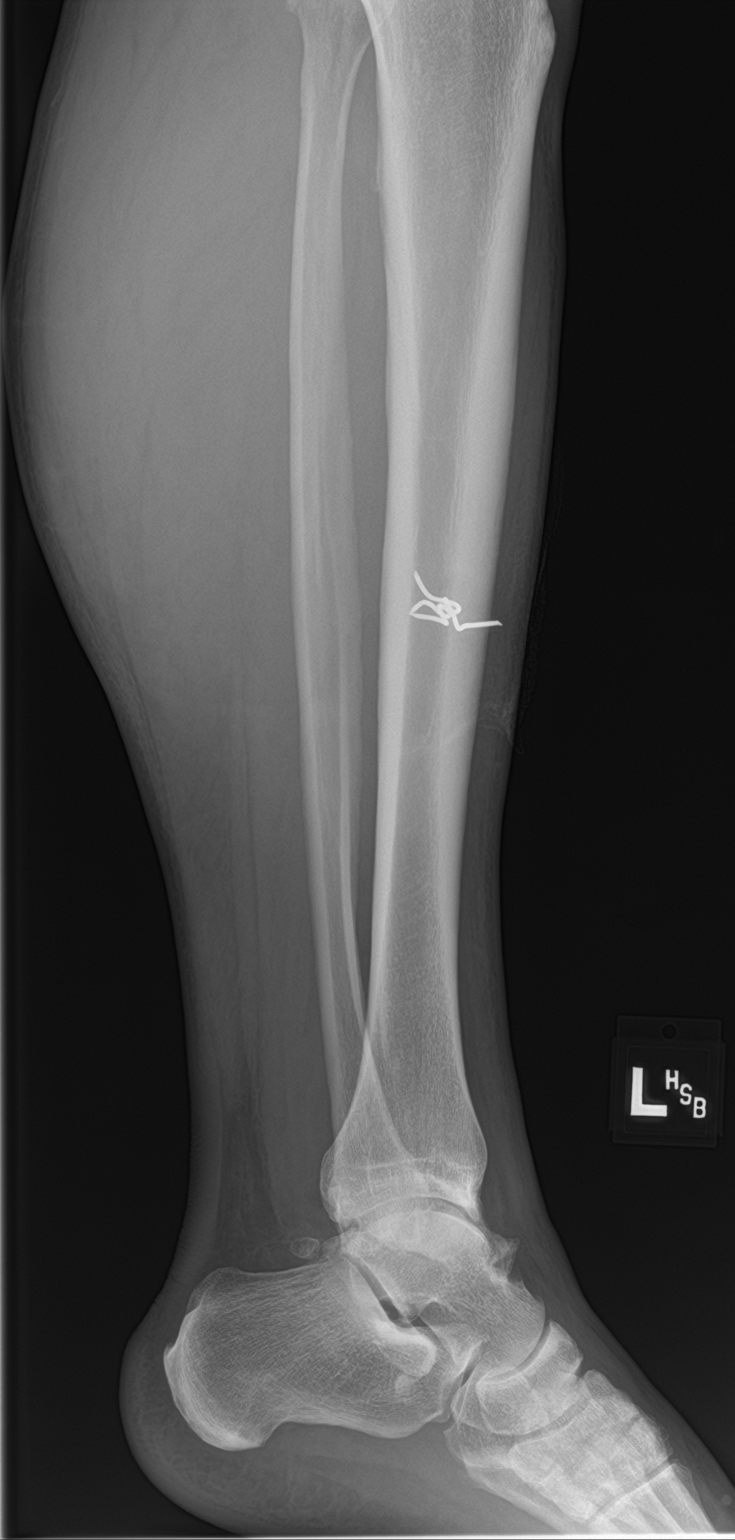
[im 4/4]
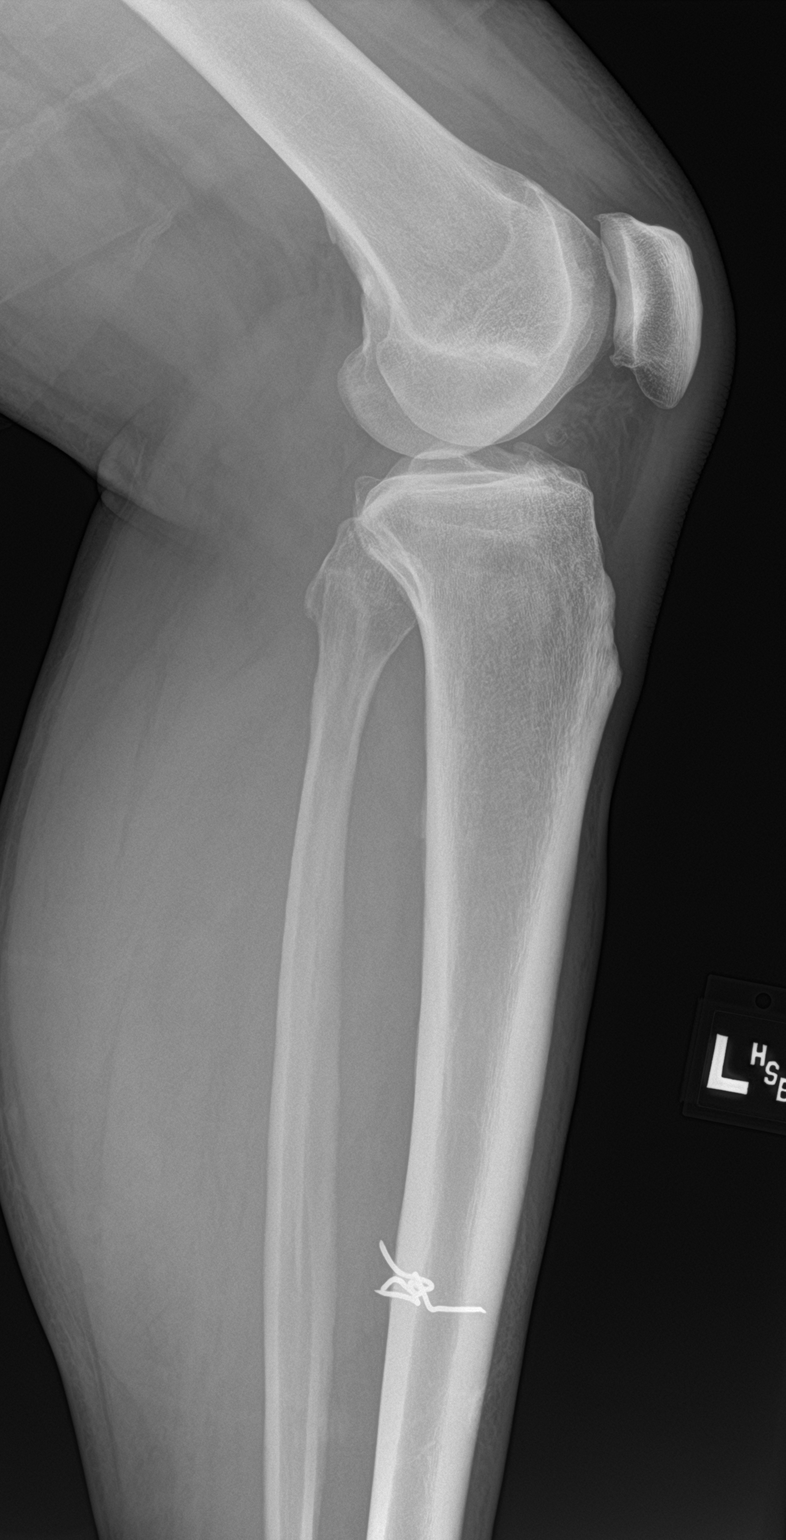

[4 of 4 positions shown; findings below may reference images not displayed]

FINDINGS: There is a coiled, curvilinear metallic foreign body in the lateral
soft tissues of the mid left lower leg with overlying bandage
material and soft tissue irregularity. No fracture is seen.
IMPRESSION: Broken piece of barbed wire in the lateral soft tissues of the mid
left lower leg without fracture.

## 2019-10-18 IMAGING — DX DG TIBIA/FIBULA 2V*L*
4 series · 4 of 4 positions shown · non-contrast
Comparison: None.

CLINICAL DATA: Foreign body removal left lower lateral leg,
evaluate for residual

EXAM:
LEFT TIBIA AND FIBULA - 2 VIEW

[tibia ap (1 of 2)]
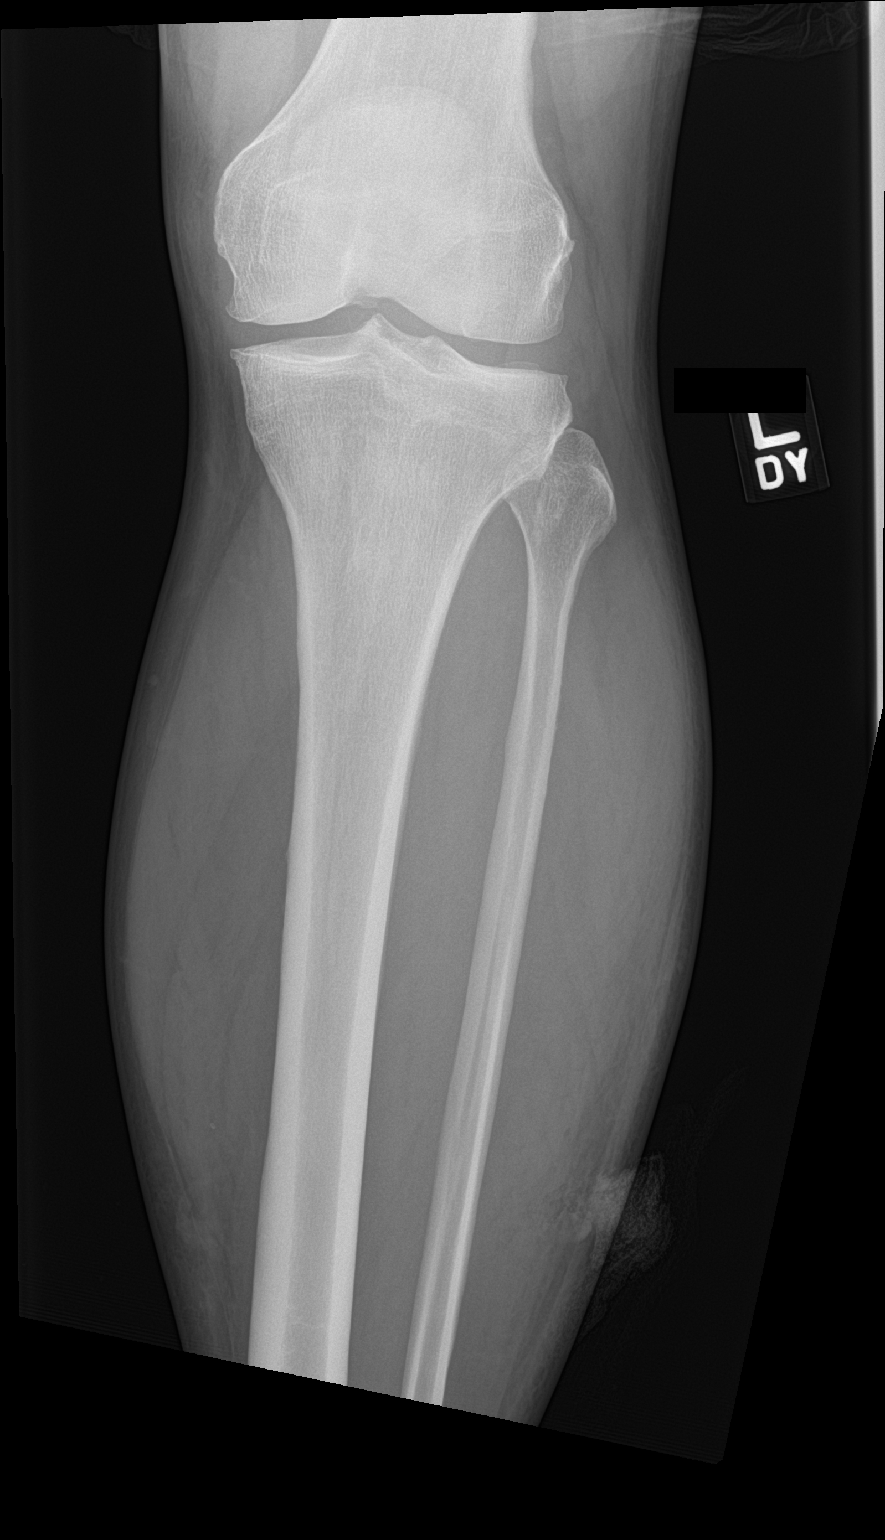

[tibia ap (2 of 2)]
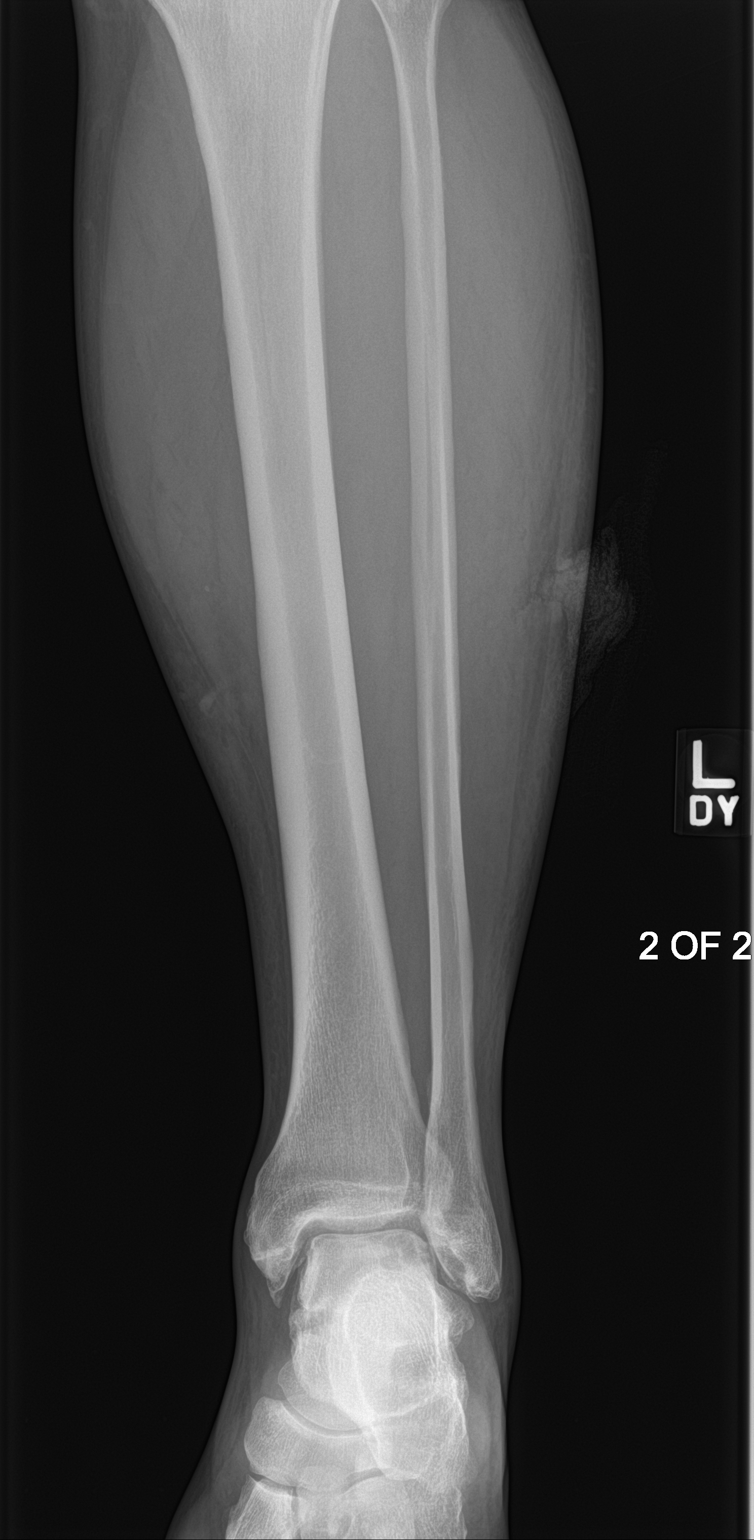

[tibia lat (1 of 2)]
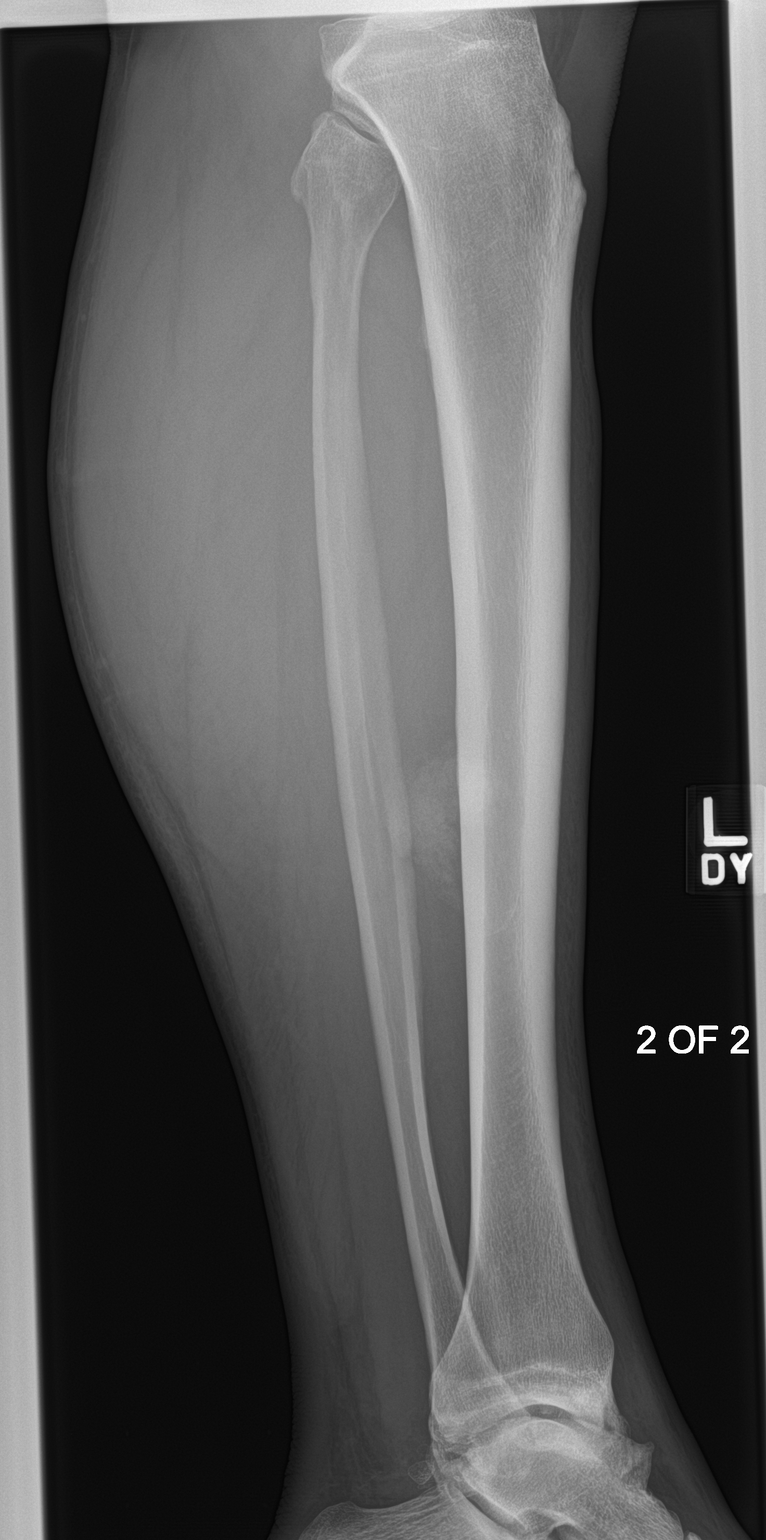

[tibia lat (2 of 2)]
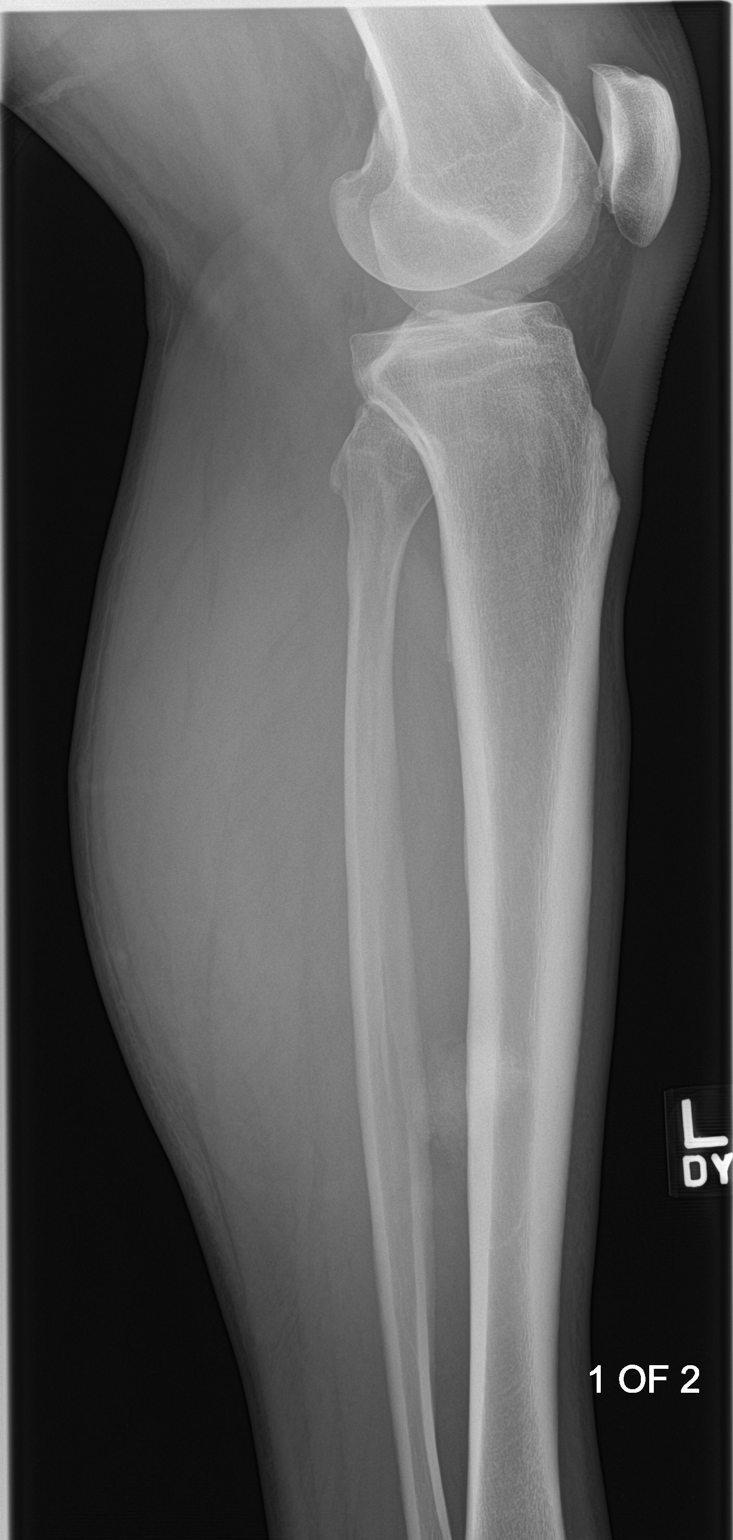

[4 of 4 positions shown; findings below may reference images not displayed]

FINDINGS: Soft tissue laceration along the lateral aspect of the mid fibula.
No radiopaque foreign body is seen.

No fracture or dislocation is seen.

The joint spaces are preserved.
IMPRESSION: Soft tissue laceration along the lateral aspect of the mid fibula.
No radiopaque foreign body is seen.

## 2020-08-26 ENCOUNTER — Telehealth: Payer: Self-pay | Admitting: Nurse Practitioner

## 2020-08-26 NOTE — Telephone Encounter (Signed)
Called to Discuss with patient about Covid symptoms and the use of the monoclonal antibody infusion for those with mild to moderate Covid symptoms and at a high risk of hospitalization.     Pt appears to qualify for this infusion due to co-morbid conditions and/or a member of an at-risk group in accordance with the FDA Emergency Use Authorization. (BMI >25)   Unable to reach pt. Voicemail left.   Willette Alma, NP [facility]  [phone]

## 2020-08-27 ENCOUNTER — Telehealth: Payer: Self-pay | Admitting: Physician Assistant

## 2020-08-27 ENCOUNTER — Telehealth: Payer: Self-pay | Admitting: Nurse Practitioner

## 2020-08-27 NOTE — Telephone Encounter (Signed)
COVID [facility] Contact Note   Attempted contact via telephone to discuss symptoms and evaluate for interest and qualifications for [facility] treatment. Patient qualifies for at-risk group according to the FDA Emergency Use Authorization.   Unable to reach patient by telephone. Voicemail left with reason for call and call back information for [facility] hotline at [phone].   No active MyChart.   Shawna Clamp, DNP, AGNP-c COVID-19 [facility] [phone]

## 2020-08-27 NOTE — Telephone Encounter (Signed)
Called to discuss with patient about Covid symptoms and the use of sotrovimab, bamlanivimab/etesevimab or casirivimab/imdevimab, a monoclonal antibody infusion for those with mild to moderate Covid symptoms and at a high risk of hospitalization.  Pt is qualified for this infusion at the [facility] due to; Specific high risk criteria : BMI > 25   Message left to call back our hotline [phone]. My chart message sent if active on Mychart.   Raneshia Derick PA-C  

## 2020-08-28 ENCOUNTER — Telehealth (HOSPITAL_COMMUNITY): Payer: Self-pay | Admitting: Family

## 2020-08-28 ENCOUNTER — Other Ambulatory Visit: Payer: Self-pay

## 2020-08-28 ENCOUNTER — Emergency Department: Payer: BC Managed Care – PPO

## 2020-08-28 ENCOUNTER — Inpatient Hospital Stay
Admission: EM | Admit: 2020-08-28 | Discharge: 2020-09-07 | DRG: 177 | Disposition: A | Discharge status: Home / Self Care | Payer: BC Managed Care – PPO | Attending: Internal Medicine | Admitting: Internal Medicine

## 2020-08-28 DIAGNOSIS — Z791 Long term (current) use of non-steroidal anti-inflammatories (NSAID): Secondary | ICD-10-CM | POA: Diagnosis not present

## 2020-08-28 DIAGNOSIS — Z87891 Personal history of nicotine dependence: Secondary | ICD-10-CM

## 2020-08-28 DIAGNOSIS — J069 Acute upper respiratory infection, unspecified: Secondary | ICD-10-CM | POA: Diagnosis present

## 2020-08-28 DIAGNOSIS — R0902 Hypoxemia: Secondary | ICD-10-CM

## 2020-08-28 DIAGNOSIS — U071 COVID-19: Secondary | ICD-10-CM

## 2020-08-28 DIAGNOSIS — G4733 Obstructive sleep apnea (adult) (pediatric): Secondary | ICD-10-CM | POA: Diagnosis present

## 2020-08-28 DIAGNOSIS — Z9119 Patient's noncompliance with other medical treatment and regimen: Secondary | ICD-10-CM | POA: Diagnosis not present

## 2020-08-28 DIAGNOSIS — T380X5A Adverse effect of glucocorticoids and synthetic analogues, initial encounter: Secondary | ICD-10-CM | POA: Diagnosis present

## 2020-08-28 DIAGNOSIS — J1282 Pneumonia due to coronavirus disease 2019: Secondary | ICD-10-CM | POA: Diagnosis present

## 2020-08-28 DIAGNOSIS — E875 Hyperkalemia: Secondary | ICD-10-CM | POA: Diagnosis present

## 2020-08-28 DIAGNOSIS — K219 Gastro-esophageal reflux disease without esophagitis: Secondary | ICD-10-CM | POA: Diagnosis present

## 2020-08-28 DIAGNOSIS — J9601 Acute respiratory failure with hypoxia: Secondary | ICD-10-CM | POA: Diagnosis present

## 2020-08-28 DIAGNOSIS — Z7952 Long term (current) use of systemic steroids: Secondary | ICD-10-CM | POA: Diagnosis not present

## 2020-08-28 DIAGNOSIS — Z7984 Long term (current) use of oral hypoglycemic drugs: Secondary | ICD-10-CM

## 2020-08-28 DIAGNOSIS — R0602 Shortness of breath: Secondary | ICD-10-CM | POA: Diagnosis present

## 2020-08-28 DIAGNOSIS — R739 Hyperglycemia, unspecified: Secondary | ICD-10-CM

## 2020-08-28 DIAGNOSIS — Z79899 Other long term (current) drug therapy: Secondary | ICD-10-CM | POA: Diagnosis not present

## 2020-08-28 DIAGNOSIS — E1165 Type 2 diabetes mellitus with hyperglycemia: Secondary | ICD-10-CM | POA: Diagnosis present

## 2020-08-28 LAB — COMPREHENSIVE METABOLIC PANEL
ALT: 19 U/L (ref 0–44)
AST: 37 U/L (ref 15–41)
Albumin: 3.1 g/dL — ABNORMAL LOW (ref 3.5–5.0)
Alkaline Phosphatase: 52 U/L (ref 38–126)
Anion gap: 11 (ref 5–15)
BUN: 22 mg/dL — ABNORMAL HIGH (ref 6–20)
CO2: 24 mmol/L (ref 22–32)
Calcium: 7.9 mg/dL — ABNORMAL LOW (ref 8.9–10.3)
Chloride: 95 mmol/L — ABNORMAL LOW (ref 98–111)
Creatinine, Ser: 1.11 mg/dL (ref 0.61–1.24)
GFR, Estimated: >60 mL/min (ref >60)
Glucose, Bld: 350 mg/dL — ABNORMAL HIGH (ref 70–99)
Potassium: 5.5 mmol/L — ABNORMAL HIGH (ref 3.5–5.1)
Sodium: 130 mmol/L — ABNORMAL LOW (ref 135–145)
Total Bilirubin: 0.6 mg/dL (ref 0.3–1.2)
Total Protein: 7 g/dL (ref 6.5–8.1)

## 2020-08-28 LAB — CBC
HCT: 36.3 % — ABNORMAL LOW (ref 39.0–52.0)
HCT: 37.3 % — ABNORMAL LOW (ref 39.0–52.0)
Hemoglobin: 12.7 g/dL — ABNORMAL LOW (ref 13.0–17.0)
Hemoglobin: 13.2 g/dL (ref 13.0–17.0)
MCH: 29.4 pg (ref 26.0–34.0)
MCH: 29.7 pg (ref 26.0–34.0)
MCHC: 35 g/dL (ref 30.0–36.0)
MCHC: 35.4 g/dL (ref 30.0–36.0)
MCV: 84 fL (ref 80.0–100.0)
MCV: 84 fL (ref 80.0–100.0)
Platelets: 188 10*3/uL (ref 150–400)
Platelets: 198 10*3/uL (ref 150–400)
RBC: 4.32 MIL/uL (ref 4.22–5.81)
RBC: 4.44 MIL/uL (ref 4.22–5.81)
RDW: 11.2 % — ABNORMAL LOW (ref 11.5–15.5)
RDW: 11.2 % — ABNORMAL LOW (ref 11.5–15.5)
WBC: 7.4 10*3/uL (ref 4.0–10.5)
WBC: 7.5 10*3/uL (ref 4.0–10.5)
nRBC: 0 % (ref 0.0–0.2)
nRBC: 0 % (ref 0.0–0.2)

## 2020-08-28 LAB — CBG MONITORING, ED
Glucose-Capillary: 315 mg/dL — ABNORMAL HIGH (ref 70–99)
Glucose-Capillary: 273 mg/dL — ABNORMAL HIGH (ref 70–99)
Glucose-Capillary: 370 mg/dL — ABNORMAL HIGH (ref 70–99)

## 2020-08-28 LAB — POTASSIUM: Potassium: 5.4 mmol/L — ABNORMAL HIGH (ref 3.5–5.1)

## 2020-08-28 LAB — TROPONIN I (HIGH SENSITIVITY)
Troponin I (High Sensitivity): 7 ng/L (ref <18)
Troponin I (High Sensitivity): 9 ng/L (ref <18)

## 2020-08-28 LAB — FIBRIN DERIVATIVES D-DIMER (ARMC ONLY): Fibrin derivatives D-dimer (ARMC): 4098.41 ng/mL (FEU) — ABNORMAL HIGH (ref 0.00–499.00)

## 2020-08-28 MED ORDER — ACETAMINOPHEN 500 MG PO TABS
1000.0000 mg | ORAL_TABLET | Freq: Once | ORAL | Status: AC
  Administered 2020-08-28: 1000 mg via ORAL
  Filled 2020-08-28: qty 2

## 2020-08-28 MED ORDER — FOLIC ACID 1 MG PO TABS
1.0000 mg | ORAL_TABLET | Freq: Every day | ORAL | Status: DC
  Administered 2020-08-28 – 2020-09-07 (×11): 1 mg via ORAL
  Filled 2020-08-28 (×11): qty 1

## 2020-08-28 MED ORDER — ENOXAPARIN SODIUM 60 MG/0.6ML ~~LOC~~ SOLN
50.0000 mg | SUBCUTANEOUS | Status: DC
  Administered 2020-08-28 – 2020-08-29 (×2): 50 mg via SUBCUTANEOUS
  Filled 2020-08-28 (×2): qty 0.6

## 2020-08-28 MED ORDER — GLIMEPIRIDE 1 MG PO TABS
1.0000 mg | ORAL_TABLET | Freq: Every day | ORAL | Status: DC
  Filled 2020-08-28 (×2): qty 1

## 2020-08-28 MED ORDER — DEXAMETHASONE SODIUM PHOSPHATE 10 MG/ML IJ SOLN
6.0000 mg | INTRAMUSCULAR | Status: DC
  Administered 2020-08-28: 6 mg via INTRAVENOUS
  Filled 2020-08-28: qty 1

## 2020-08-28 MED ORDER — SODIUM CHLORIDE 0.9 % IV SOLN
200.0000 mg | Freq: Once | INTRAVENOUS | Status: AC
  Administered 2020-08-28: 200 mg via INTRAVENOUS
  Filled 2020-08-28: qty 200

## 2020-08-28 MED ORDER — SODIUM CHLORIDE 0.9 % IV SOLN: Freq: Once | INTRAVENOUS | Status: AC

## 2020-08-28 MED ORDER — ADULT MULTIVITAMIN W/MINERALS CH
1.0000 | ORAL_TABLET | Freq: Every day | ORAL | Status: DC
  Administered 2020-08-28 – 2020-09-07 (×11): 1 via ORAL
  Filled 2020-08-28 (×11): qty 1

## 2020-08-28 MED ORDER — PANTOPRAZOLE SODIUM 40 MG PO TBEC
40.0000 mg | DELAYED_RELEASE_TABLET | Freq: Every day | ORAL | Status: DC
  Administered 2020-08-29 – 2020-09-07 (×10): 40 mg via ORAL
  Filled 2020-08-28 (×10): qty 1

## 2020-08-28 MED ORDER — SODIUM CHLORIDE 0.9 % IV SOLN
100.0000 mg | Freq: Every day | INTRAVENOUS | Status: DC
  Filled 2020-08-28: qty 20

## 2020-08-28 MED ORDER — ALBUTEROL SULFATE HFA 108 (90 BASE) MCG/ACT IN AERS
2.0000 | INHALATION_SPRAY | Freq: Four times a day (QID) | RESPIRATORY_TRACT | Status: DC
  Administered 2020-08-28 – 2020-08-29 (×3): 2 via RESPIRATORY_TRACT
  Filled 2020-08-28: qty 6.7

## 2020-08-28 MED ORDER — SENNOSIDES-DOCUSATE SODIUM 8.6-50 MG PO TABS
1.0000 | ORAL_TABLET | Freq: Every evening | ORAL | Status: DC | PRN
  Administered 2020-09-03: 1 via ORAL
  Filled 2020-08-28: qty 1

## 2020-08-28 MED ORDER — MELOXICAM 7.5 MG PO TABS
15.0000 mg | ORAL_TABLET | Freq: Every day | ORAL | Status: DC
  Administered 2020-08-29 – 2020-09-07 (×10): 15 mg via ORAL
  Filled 2020-08-28 (×10): qty 2

## 2020-08-28 MED ORDER — ACETAMINOPHEN 325 MG PO TABS: 650.0000 mg | ORAL_TABLET | Freq: Four times a day (QID) | ORAL | Status: DC | PRN

## 2020-08-28 MED ORDER — DEXAMETHASONE SODIUM PHOSPHATE 10 MG/ML IJ SOLN
6.0000 mg | Freq: Once | INTRAMUSCULAR | Status: AC
  Administered 2020-08-28: 6 mg via INTRAVENOUS
  Filled 2020-08-28: qty 1

## 2020-08-28 MED ORDER — GUAIFENESIN-DM 100-10 MG/5ML PO SYRP
10.0000 mL | ORAL_SOLUTION | ORAL | Status: DC | PRN
  Administered 2020-08-29 – 2020-09-06 (×3): 10 mL via ORAL
  Filled 2020-08-28 (×4): qty 10

## 2020-08-28 MED ORDER — SODIUM CHLORIDE 0.9 % IV SOLN
200.0000 mg | Freq: Once | INTRAVENOUS | Status: DC
  Filled 2020-08-28: qty 40

## 2020-08-28 MED ORDER — INSULIN ASPART 100 UNIT/ML ~~LOC~~ SOLN
0.0000 [IU] | Freq: Three times a day (TID) | SUBCUTANEOUS | Status: DC
  Administered 2020-08-29 (×2): 8 [IU] via SUBCUTANEOUS
  Administered 2020-08-29: 11 [IU] via SUBCUTANEOUS
  Administered 2020-08-30: 8 [IU] via SUBCUTANEOUS
  Administered 2020-08-30: 11 [IU] via SUBCUTANEOUS
  Administered 2020-08-30 – 2020-08-31 (×2): 8 [IU] via SUBCUTANEOUS
  Administered 2020-08-31: 5 [IU] via SUBCUTANEOUS
  Administered 2020-08-31: 8 [IU] via SUBCUTANEOUS
  Administered 2020-09-01 (×3): 3 [IU] via SUBCUTANEOUS
  Administered 2020-09-02: 8 [IU] via SUBCUTANEOUS
  Administered 2020-09-02: 3 [IU] via SUBCUTANEOUS
  Administered 2020-09-02: 5 [IU] via SUBCUTANEOUS
  Administered 2020-09-03: 8 [IU] via SUBCUTANEOUS
  Administered 2020-09-03 (×2): 3 [IU] via SUBCUTANEOUS
  Administered 2020-09-04 (×2): 5 [IU] via SUBCUTANEOUS
  Administered 2020-09-04 – 2020-09-05 (×3): 8 [IU] via SUBCUTANEOUS
  Administered 2020-09-05: 5 [IU] via SUBCUTANEOUS
  Administered 2020-09-06: 11 [IU] via SUBCUTANEOUS
  Administered 2020-09-06: 15 [IU] via SUBCUTANEOUS
  Administered 2020-09-06: 8 [IU] via SUBCUTANEOUS
  Administered 2020-09-07: 5 [IU] via SUBCUTANEOUS
  Filled 2020-08-28 (×28): qty 1

## 2020-08-28 MED ORDER — SODIUM CHLORIDE 0.9 % IV SOLN
100.0000 mg | Freq: Every day | INTRAVENOUS | Status: AC
  Administered 2020-08-29 – 2020-09-01 (×4): 100 mg via INTRAVENOUS
  Filled 2020-08-28: qty 100
  Filled 2020-08-28 (×2): qty 20
  Filled 2020-08-28: qty 100

## 2020-08-28 MED ORDER — CALCIUM GLUCONATE-NACL 1-0.675 GM/50ML-% IV SOLN
1.0000 g | Freq: Once | INTRAVENOUS | Status: AC
  Administered 2020-08-28: 1000 mg via INTRAVENOUS
  Filled 2020-08-28: qty 50

## 2020-08-28 MED ORDER — GABAPENTIN 100 MG PO CAPS
100.0000 mg | ORAL_CAPSULE | Freq: Two times a day (BID) | ORAL | Status: DC
  Administered 2020-08-28 – 2020-09-07 (×20): 100 mg via ORAL
  Filled 2020-08-28 (×20): qty 1

## 2020-08-28 MED ORDER — HYDROCOD POLST-CPM POLST ER 10-8 MG/5ML PO SUER
5.0000 mL | Freq: Two times a day (BID) | ORAL | Status: DC | PRN
  Filled 2020-08-28: qty 5

## 2020-08-28 MED ORDER — INSULIN ASPART 100 UNIT/ML ~~LOC~~ SOLN
10.0000 [IU] | Freq: Once | SUBCUTANEOUS | Status: AC
  Administered 2020-08-28: 10 [IU] via INTRAVENOUS
  Filled 2020-08-28: qty 1

## 2020-08-28 MED ORDER — BENZONATATE 100 MG PO CAPS: 200.0000 mg | ORAL_CAPSULE | Freq: Three times a day (TID) | ORAL | Status: DC | PRN

## 2020-08-28 NOTE — Consult Note (Signed)
Pharmacy Remdesivir Note  KELLI EGOLF is a 54 y.o. male admitted on 08/28/2020 with Covid-19 PNA.  Pharmacy has been consulted for Remdesivir dosing.  Plan: Give 200mg  x1 IV Loading dose; followed by 100mg  daily IV x4days to complete 5 days of therapy. Alongside dexamethasone 6mg  IV x1  Height: 5\' 8"  (172.7 cm) Weight: 99.8 kg (220 lb) IBW/kg (Calculated) : 68.4  Temp (24hrs), Avg:98.3 F (36.8 C), Min:98.3 F (36.8 C), Max:98.3 F (36.8 C)  No Known Allergies  Thank you for allowing pharmacy to be a part of this patient's care.  Eisa Conaway 08/28/2020 3:12 PM

## 2020-08-28 NOTE — ED Notes (Signed)
[name], MD notified of pts D-Dimer of [phone] via secure chat. Pt in NAD at this time. No other changes in previous assessment. Awaiting further orders. Will continue to monitor.

## 2020-08-28 NOTE — ED Triage Notes (Signed)
Pt to ED where SPO2 was checked in triage and found to be 61% on RA. Covid + dx was made on 11/24. Denies chest pain.

## 2020-08-28 NOTE — ED Notes (Signed)
Contacted [name], MD via secure chat to let him know that the pts O2 requirements have increased from 4L to 6L and that the pts O2 has been decreasing into the 70s while sleeping. Pts O2 saturation currently 93% and pt in NAD at this time. Awaiting further orders. Will continue to monitor.

## 2020-08-28 NOTE — ED Provider Notes (Signed)
[facility] Emergency Department Provider Note ____________________________________________   First MD Initiated Contact with Patient 08/28/20 1456     (approximate)  I have reviewed the triage vital signs and the nursing notes.  HISTORY  Chief Complaint Respiratory Distress   HPI Gregory Johnston is a 54 y.o. malewho presents to the ED for evaluation of shortness of breath.  Chart review indicates history of DM on oral agents, GERD. 11/24 PCP visit with 3 days of symptoms, unvaccinated for Covid, subsequently testing positive and manage at home.  Patient does the ED with worsening shortness of breath in the setting of COVID-19.  He reports a nonproductive cough and exertional dyspnea is his primary complaints.  He denies any pain, chest pain, syncope, abdominal pain, emesis or diarrhea.  He does report some associated decreased p.o. intake.  Patient was noted to be hypoxic in the 60s on room air necessitating 6 L nasal cannula in triage.  Past Medical History:  Diagnosis Date   Diabetes mellitus without complication (HCC)    Sleep apnea     Patient Active Problem List   Diagnosis Date Noted   RLQ PAIN 06/17/2007    Past Surgical History:  Procedure Laterality Date   CHOLECYSTECTOMY      Prior to Admission medications   Medication Sig Start Date End Date Taking? Authorizing Provider  azithromycin (ZITHROMAX) 250 MG tablet Take 250 mg by mouth daily. 08/26/20 08/30/20 Yes [provider]  benzonatate (TESSALON) 200 MG capsule Take 200 mg by mouth every 8 (eight) hours as needed for cough. 08/25/20  Yes [provider]  gabapentin (NEURONTIN) 100 MG capsule Take 100 mg by mouth 2 (two) times daily. 08/02/20  Yes [provider]  glimepiride (AMARYL) 1 MG tablet Take 1 mg by mouth daily with breakfast.   Yes [provider]  meloxicam (MOBIC) 15 MG tablet Take 15 mg by mouth daily. 06/26/20  Yes [provider]  metFORMIN (GLUCOPHAGE) 1000 MG tablet Take 1,000 mg by mouth 2 (two) times daily with a meal.   Yes [provider]  omeprazole (PRILOSEC) 20 MG capsule Take 20 mg by mouth daily.   Yes [provider]  ondansetron (ZOFRAN-ODT) 4 MG disintegrating tablet Take 4 mg by mouth every 8 (eight) hours as needed for nausea or vomiting. 08/25/20  Yes [provider]  predniSONE (DELTASONE) 20 MG tablet Take 20 mg by mouth 2 (two) times daily. 08/25/20 08/30/20 Yes [provider]    Allergies Patient has no known allergies.  No family history on file.  Social History Social History   Tobacco Use   Smoking status: Former Smoker   Smokeless tobacco: Never Used  Building services engineer Use: Never used  Substance Use Topics   Alcohol use: Yes   Drug use: Not Currently    Review of Systems  Constitutional: No fever/chills Eyes: No visual changes. ENT: No sore throat. Cardiovascular: Denies chest pain. Respiratory: Positive for shortness of breath and nonproductive cough. Gastrointestinal: No abdominal pain.  No nausea, no vomiting.  No diarrhea.  No constipation.  Positive decreased p.o. intake. Genitourinary: Negative for dysuria. Musculoskeletal: Negative for back pain. Skin: Negative for rash. Neurological: Negative for headaches, focal weakness or numbness.  ____________________________________________   PHYSICAL EXAM:  VITAL SIGNS: Vitals:   08/28/20 1600 08/28/20 1630  BP: 123/72 116/68  Pulse: 76 77  Resp: 20 20  Temp:    SpO2: 98% 96%  Constitutional: Alert and oriented. Well appearing and in no acute distress.  Conversational dyspnea. Eyes: Conjunctivae are normal. PERRL. EOMI. Head: Atraumatic. Nose: No congestion/rhinnorhea. Mouth/Throat: Mucous membranes are moist.  Oropharynx non-erythematous. Neck: No stridor. No cervical spine tenderness to palpation. Cardiovascular: Normal rate, regular rhythm.  Grossly normal heart sounds.  Good peripheral circulation. Respiratory: Mild tachypnea to the mid 20s, otherwise no evidence of distress.  Clear lungs.. Gastrointestinal: Soft , nondistended, nontender to palpation. No CVA tenderness. Musculoskeletal: No lower extremity tenderness nor edema.  No joint effusions. No signs of acute trauma. Neurologic:  Normal speech and language. No gross focal neurologic deficits are appreciated. No gait instability noted. Skin:  Skin is warm, dry and intact. No rash noted. Psychiatric: Mood and affect are normal. Speech and behavior are normal.  ____________________________________________   LABS (all labs ordered are listed, but only abnormal results are displayed)  Labs Reviewed  CBC - Abnormal; Notable for the following components:      Result Value   Hemoglobin 12.7 (*)    HCT 36.3 (*)    RDW 11.2 (*)    All other components within normal limits  COMPREHENSIVE METABOLIC PANEL - Abnormal; Notable for the following components:   Sodium 130 (*)    Potassium 5.5 (*)    Chloride 95 (*)    Glucose, Bld 350 (*)    BUN 22 (*)    Calcium 7.9 (*)    Albumin 3.1 (*)    All other components within normal limits  HEMOGLOBIN A1C  TROPONIN I (HIGH SENSITIVITY)  TROPONIN I (HIGH SENSITIVITY)   ____________________________________________  12 Lead EKG  Sinus rhythm, rate of 84 bpm.  Normal axis and intervals.  No evidence of acute ischemia. ____________________________________________  RADIOLOGY  ED MD interpretation: 1 view CXR reviewed by me with evidence of multifocal infiltrates consistent with known COVID-19 without evidence of superimposed bacterial lobar infiltration  Official radiology report(s): DG Chest Portable 1 View  Result Date: 08/28/2020 CLINICAL DATA:  COVID positive with hypoxia. EXAM: PORTABLE CHEST 1 VIEW COMPARISON:  Report from chest radiograph dated 08/25/2020. FINDINGS: The heart size is normal. Moderate patchy bilateral  airspace opacities are noted. There is no pleural effusion or pneumothorax. The osseous structures are intact. IMPRESSION: Moderate patchy bilateral airspace opacities are consistent with COVID-19 pneumonia. Electronically Signed   By: Romona Curls M.D.   On: 08/28/2020 15:46    ____________________________________________   PROCEDURES and INTERVENTIONS  Procedure(s) performed (including Critical Care):  .1-3 Lead EKG Interpretation Performed by: Delton Prairie, MD Authorized by: Delton Prairie, MD     Interpretation: normal     ECG rate:  74   ECG rate assessment: normal     Rhythm: sinus rhythm     Ectopy: none     Conduction: normal   .Critical Care Performed by: Delton Prairie, MD Authorized by: Delton Prairie, MD   Critical care provider statement:    Critical care time (minutes):  30   Critical care was necessary to treat or prevent imminent or life-threatening deterioration of the following conditions:  Respiratory failure and metabolic crisis   Critical care was time spent personally by me on the following activities:  Discussions with consultants, evaluation of patient's response to treatment, examination of patient, ordering and performing treatments and interventions, ordering and review of laboratory studies, ordering and review of radiographic studies, pulse oximetry, re-evaluation of patient's condition, obtaining history from patient or surrogate and review of old charts    Medications  remdesivir  200 mg in sodium chloride 0.9% 250 mL IVPB (0 mg Intravenous Stopped 08/28/20 1614)    Followed by  remdesivir 100 mg in sodium chloride 0.9 % 100 mL IVPB (has no administration in time range)  insulin aspart (novoLOG) injection 10 Units (has no administration in time range)  calcium gluconate 1 g/ 50 mL sodium chloride IVPB (has no administration in time range)  acetaminophen (TYLENOL) tablet 1,000 mg (1,000 mg Oral Given 08/28/20 1528)  dexamethasone (DECADRON) injection 6 mg  (6 mg Intravenous Given 08/28/20 1528)    ____________________________________________   MDM / ED COURSE   Unvaccinated 53 year old male presents to the ED with worsening Covid symptoms, found to be hypoxic and with hyperkalemia, requiring medical admission.  Hypoxic to the 60s on room air necessitating 6 L nasal cannula, otherwise normal vitals.  Exam with minimal tachypnea, otherwise no evidence of distress.  No evidence of trauma, neurovascular deficits or additional acute pathology.  Blood work with hyperkalemia to 5.5, for which she received 10 units of IV insulin and 1 g of calcium gluconate.  EKG is nonischemic and without hyperkalemic changes.  CXR with expected opacities in setting of COVID-19 without evidence of superimposed bacterial pneumonia.  Provided patient Decadron due to hypoxia associated with COVID-19.  We will admit the patient to hospitalist medicine for further work-up and management.   Clinical Course as of Aug 29 1651  Sat Aug 28, 2020  1623 Spoke with admitting hospitalist regarding admission   [DS]    Clinical Course User Index [DS] Delton Prairie, MD    ____________________________________________   FINAL CLINICAL IMPRESSION(S) / ED DIAGNOSES  Final diagnoses:  COVID-19  Hypoxia  Shortness of breath  Hyperkalemia  Hyperglycemia     ED Discharge Orders    None       Claudina Oliphant Katrinka Blazing   Note:  This document was prepared using Dragon voice recognition software and may include unintentional dictation errors.   Delton Prairie, MD 08/28/20 [phone]

## 2020-08-28 NOTE — Progress Notes (Addendum)
Patient ID: Gregory Johnston, male   DOB: [birth year], 54 y.o.   MRN: [id number]   FDP came back elevated at 4098. Consistent with inflammatory markers related to COVID. Patient may be considered for CTA when clinically stable.

## 2020-08-28 NOTE — Telephone Encounter (Signed)
Called to discuss with Florencia Reasons about Covid symptoms and potential candidacy for the use of sotrovimab, a combination monoclonal antibody infusion for those with mild to moderate Covid symptoms and at a high risk of hospitalization.     Pt is qualified for this infusion at the infusion center due to co-morbid conditions and/or a member of an at-risk group, however unable to reach patient. He is currently in the ED. Called him and VM left.   Matai Carpenito,NP

## 2020-08-28 NOTE — H&P (Addendum)
Chief Complaint: I have worsening shortness of breath HPI:  Gregory Johnston is a 54 y.o. male with medical history significant for type 2 diabetes mellitus and obstructive sleep apnea.  He is not up-to-date with his Covid vaccination and presented to his primary care physician on 08/25/2020 with complaints of symptoms of generalized malaise, cough and with shortness of breath after contact with wife tested positive for Covid.  Patient also tested positive on the day but was deemed stable for conservative management at home.  Today, patient presents due to worsening shortness of breath with minimal exertion.  Symptoms have been associated with nonproductive cough, fever and chills.  Patient was noted to be hypoxic on presentation and required 6 L/min of oxygen at triage.  Chest x-ray was done showed evidence of interstitial pneumonia.  Patient was given 6 mg x 1 of Decadron and remdesivir 200 mg was also offered.  Patient was referred to hospitalist service for admission and further management.  Past Medical History:  Diagnosis Date  . Diabetes mellitus without complication (HCC)   . Sleep apnea     Past Surgical History:  Procedure Laterality Date  . CHOLECYSTECTOMY      No family history on file. Social History:  reports that he has quit smoking. He has never used smokeless tobacco. He reports current alcohol use. He reports previous drug use.  Allergies: No Known Allergies  (Not in a hospital admission)   Results for orders placed or performed during the hospital encounter of 08/28/20 (from the past 48 hour(s))  CBC     Status: Abnormal   Collection Time: 08/28/20  3:23 PM  Result Value Ref Range   WBC 7.4 4.0 - 10.5 K/uL   RBC 4.32 4.22 - 5.81 MIL/uL   Hemoglobin 12.7 (L) 13.0 - 17.0 g/dL   HCT 61.9 (L) 39 - 52 %   MCV 84.0 80.0 - 100.0 fL   MCH 29.4 26.0 - 34.0 pg   MCHC 35.0 30.0 - 36.0 g/dL   RDW 50.9 (L) 32.6 - 71.2 %   Platelets 188 150 - 400 K/uL   nRBC 0.0 0.0 - 0.2 %     Comment: Performed at [facility], [address], [place], [place] [address]  Troponin I (High Sensitivity)     Status: None   Collection Time: 08/28/20  3:23 PM  Result Value Ref Range   Troponin I (High Sensitivity) 7 <18 ng/L    Comment: (NOTE) Elevated high sensitivity troponin I (hsTnI) values and significant  changes across serial measurements may suggest ACS but many other  chronic and acute conditions are known to elevate hsTnI results.  Refer to the "Links" section for chest pain algorithms and additional  guidance. Performed at [facility], [address] [place], [place] [address]   Comprehensive metabolic panel     Status: Abnormal   Collection Time: 08/28/20  3:23 PM  Result Value Ref Range   Sodium 130 (L) 135 - 145 mmol/L   Potassium 5.5 (H) 3.5 - 5.1 mmol/L   Chloride 95 (L) 98 - 111 mmol/L   CO2 24 22 - 32 mmol/L   Glucose, Bld 350 (H) 70 - 99 mg/dL    Comment: Glucose reference range applies only to samples taken after fasting for at least 8 hours.   BUN 22 (H) 6 - 20 mg/dL   Creatinine, Ser 2.50 0.61 - 1.24 mg/dL   Calcium 7.9 (L) 8.9 - 10.3 mg/dL   Total Protein  7.0 6.5 - 8.1 g/dL   Albumin 3.1 (L) 3.5 - 5.0 g/dL   AST 37 15 - 41 U/L   ALT 19 0 - 44 U/L   Alkaline Phosphatase 52 38 - 126 U/L   Total Bilirubin 0.6 0.3 - 1.2 mg/dL   GFR, Estimated >00 >93 mL/min    Comment: (NOTE) Calculated using the CKD-EPI Creatinine Equation (2021)    Anion gap 11 5 - 15    Comment: Performed at [facility], [address] [place], [place] [address]  CBG monitoring, ED     Status: Abnormal   Collection Time: 08/28/20  5:21 PM  Result Value Ref Range   Glucose-Capillary 315 (H) 70 - 99 mg/dL    Comment: Glucose reference range applies only to samples taken after fasting for at least 8 hours.   DG Chest Portable 1 View  Result Date: 08/28/2020 CLINICAL DATA:  COVID positive with hypoxia. EXAM: PORTABLE CHEST 1 VIEW COMPARISON:   Report from chest radiograph dated 08/25/2020. FINDINGS: The heart size is normal. Moderate patchy bilateral airspace opacities are noted. There is no pleural effusion or pneumothorax. The osseous structures are intact. IMPRESSION: Moderate patchy bilateral airspace opacities are consistent with COVID-19 pneumonia. Electronically Signed   By: Romona Curls M.D.   On: 08/28/2020 15:46    Review of Systems  Constitutional: Positive for activity change, chills and fatigue.  HENT: Negative for congestion and sore throat.   Respiratory: Positive for shortness of breath and wheezing.   Cardiovascular: Negative.   Gastrointestinal: Negative for abdominal distention, abdominal pain and anal bleeding.  Endocrine: Negative for cold intolerance and heat intolerance.  Genitourinary: Negative for difficulty urinating and dysuria.  Skin: Negative for color change.  Neurological: Negative for dizziness and numbness.    Blood pressure 116/68, pulse 77, temperature 98.3 F (36.8 C), temperature source Oral, resp. rate 20, height 5\' 8"  (1.727 m), weight 99.8 kg, SpO2 96 %. Physical Exam Constitutional:      Appearance: Normal appearance.  HENT:     Head: Normocephalic and atraumatic.     Mouth/Throat:     Mouth: Mucous membranes are moist.  Eyes:     Conjunctiva/sclera: Conjunctivae normal.  Cardiovascular:     Rate and Rhythm: Normal rate and regular rhythm.  Pulmonary:     Effort: Pulmonary effort is normal.  Abdominal:     General: Bowel sounds are normal.     Palpations: Abdomen is soft.  Musculoskeletal:        General: Normal range of motion.     Cervical back: Normal range of motion and neck supple.  Skin:    General: Skin is warm and dry.     Capillary Refill: Capillary refill takes less than 2 seconds.  Neurological:     Mental Status: He is alert.  Psychiatric:        Mood and Affect: Mood normal.        Behavior: Behavior normal.      Assessment/Plan  Acute hypoxia secondary  to COVID-19 pneumonia: Patient currently on 6 L/min of oxygen requirement.  Patient was treated with Decadron.  Antivirals with remdesivir were initiated.  Patient will be conservatively managed with plan to improve respiratory function and wean off oxygen as tolerated.  Continue with MDI inhalers patient is unvaccinated for Covid.  Diabetes mellitus type 2: Blood glucose is uncontrolled on presentation.  Patient sulfonylurea will resume it.  Metformin was held.  Blood glucose will be optimized with insulin sliding  scale.  Patient is high risk for hypoglycemia due to the effect of glucocorticoids.  History of obstructive sleep apnea: Noncompliant with CPAP at home  Hyperkalemia: Etiology unclear. No EKG changes. Patient is asymptomatic. Repeat serum potassium, if still elevated post hydration, patient will consider for Lokelma or Kayexalate.  Lilia Pro, MD 08/28/2020, 5:23 PM

## 2020-08-29 ENCOUNTER — Encounter: Payer: Self-pay | Admitting: Internal Medicine

## 2020-08-29 LAB — C-REACTIVE PROTEIN: CRP: 12.8 mg/dL — ABNORMAL HIGH (ref <1.0)

## 2020-08-29 LAB — CBC WITH DIFFERENTIAL/PLATELET
Abs Immature Granulocytes: 0.06 10*3/uL (ref 0.00–0.07)
Basophils Absolute: 0 10*3/uL (ref 0.0–0.1)
Basophils Relative: 0 %
Eosinophils Absolute: 0 10*3/uL (ref 0.0–0.5)
Eosinophils Relative: 0 %
HCT: 38.3 % — ABNORMAL LOW (ref 39.0–52.0)
Hemoglobin: 13.5 g/dL (ref 13.0–17.0)
Immature Granulocytes: 1 %
Lymphocytes Relative: 10 %
Lymphs Abs: 0.6 10*3/uL — ABNORMAL LOW (ref 0.7–4.0)
MCH: 30 pg (ref 26.0–34.0)
MCHC: 35.2 g/dL (ref 30.0–36.0)
MCV: 85.1 fL (ref 80.0–100.0)
Monocytes Absolute: 0.3 10*3/uL (ref 0.1–1.0)
Monocytes Relative: 5 %
Neutro Abs: 5 10*3/uL (ref 1.7–7.7)
Neutrophils Relative %: 84 %
Platelets: 230 10*3/uL (ref 150–400)
RBC: 4.5 MIL/uL (ref 4.22–5.81)
RDW: 11.3 % — ABNORMAL LOW (ref 11.5–15.5)
WBC: 6 10*3/uL (ref 4.0–10.5)
nRBC: 0 % (ref 0.0–0.2)

## 2020-08-29 LAB — COMPREHENSIVE METABOLIC PANEL
ALT: 22 U/L (ref 0–44)
AST: 37 U/L (ref 15–41)
Albumin: 3.1 g/dL — ABNORMAL LOW (ref 3.5–5.0)
Alkaline Phosphatase: 57 U/L (ref 38–126)
Anion gap: 11 (ref 5–15)
BUN: 24 mg/dL — ABNORMAL HIGH (ref 6–20)
CO2: 24 mmol/L (ref 22–32)
Calcium: 8.5 mg/dL — ABNORMAL LOW (ref 8.9–10.3)
Chloride: 97 mmol/L — ABNORMAL LOW (ref 98–111)
Creatinine, Ser: 1.22 mg/dL (ref 0.61–1.24)
GFR, Estimated: >60 mL/min (ref >60)
Glucose, Bld: 338 mg/dL — ABNORMAL HIGH (ref 70–99)
Potassium: 5.1 mmol/L (ref 3.5–5.1)
Sodium: 132 mmol/L — ABNORMAL LOW (ref 135–145)
Total Bilirubin: 0.8 mg/dL (ref 0.3–1.2)
Total Protein: 7.4 g/dL (ref 6.5–8.1)

## 2020-08-29 LAB — GLUCOSE, CAPILLARY
Glucose-Capillary: 334 mg/dL — ABNORMAL HIGH (ref 70–99)
Glucose-Capillary: 292 mg/dL — ABNORMAL HIGH (ref 70–99)

## 2020-08-29 LAB — EXPECTORATED SPUTUM ASSESSMENT W GRAM STAIN, RFLX TO RESP C

## 2020-08-29 LAB — CBG MONITORING, ED
Glucose-Capillary: 271 mg/dL — ABNORMAL HIGH (ref 70–99)
Glucose-Capillary: 260 mg/dL — ABNORMAL HIGH (ref 70–99)

## 2020-08-29 LAB — HIV ANTIBODY (ROUTINE TESTING W REFLEX): HIV Screen 4th Generation wRfx: NONREACTIVE

## 2020-08-29 LAB — PROCALCITONIN: Procalcitonin: 0.1 ng/mL

## 2020-08-29 LAB — HEMOGLOBIN A1C
Hgb A1c MFr Bld: 8 % — ABNORMAL HIGH (ref 4.8–5.6)
Mean Plasma Glucose: 182.9 mg/dL

## 2020-08-29 MED ORDER — BARICITINIB 2 MG PO TABS
4.0000 mg | ORAL_TABLET | Freq: Every day | ORAL | Status: DC
  Administered 2020-08-29 – 2020-09-07 (×9): 4 mg via ORAL
  Filled 2020-08-29 (×14): qty 2

## 2020-08-29 MED ORDER — IPRATROPIUM-ALBUTEROL 20-100 MCG/ACT IN AERS
1.0000 | INHALATION_SPRAY | Freq: Four times a day (QID) | RESPIRATORY_TRACT | Status: DC
  Administered 2020-08-29 – 2020-09-07 (×37): 1 via RESPIRATORY_TRACT
  Filled 2020-08-29: qty 4

## 2020-08-29 MED ORDER — METHYLPREDNISOLONE SODIUM SUCC 40 MG IJ SOLR
40.0000 mg | Freq: Three times a day (TID) | INTRAMUSCULAR | Status: DC
  Administered 2020-08-29 – 2020-08-31 (×8): 40 mg via INTRAVENOUS
  Filled 2020-08-29 (×8): qty 1

## 2020-08-29 MED ORDER — INSULIN DETEMIR 100 UNIT/ML ~~LOC~~ SOLN
10.0000 [IU] | Freq: Two times a day (BID) | SUBCUTANEOUS | Status: DC
  Administered 2020-08-29 (×2): 10 [IU] via SUBCUTANEOUS
  Filled 2020-08-29 (×5): qty 0.1

## 2020-08-29 NOTE — ED Notes (Signed)
Advised nurse that patient has assigned bed 

## 2020-08-29 NOTE — ED Notes (Signed)
Pt standing to urinate during last vitals check. Pt continues to desat when standing.

## 2020-08-29 NOTE — ED Notes (Signed)
RT consulted pt placed on 10L humidified [place].

## 2020-08-29 NOTE — ED Notes (Addendum)
Pt on 15L humidified O2 via [place] with an O2 saturation of 84-88%. RT notified. RT to consult and put on heated high flow. Will continue to monitor.

## 2020-08-29 NOTE — ED Notes (Signed)
Katherina Right, NP paged to inform of increased O2 requirements. Awaiting call back.

## 2020-08-29 NOTE — Progress Notes (Signed)
PROGRESS NOTE    Gregory Johnston  JGG:[id number] DOB: 08-Aug-1966 DOA: 08/28/2020 PCP: Barbette Reichmann, MD    Assessment & Plan:   Active Problems:   Acute respiratory disease due to COVID-19 virus    Gregory Johnston is a 54 y.o. male with medical history significant for type 2 diabetes mellitus and obstructive sleep apnea.  He is not up-to-date with his Covid vaccination and presented to his primary care physician on 08/25/2020 with complaints of symptoms of generalized malaise, cough and with shortness of breath after contact with wife tested positive for Covid.  Patient also tested positive on the day but was deemed stable for conservative management at home.  Today, patient presents due to worsening shortness of breath with minimal exertion.  Symptoms have been associated with nonproductive cough, fever and chills.  Patient was noted to be hypoxic on presentation and required 6 L/min of oxygen at triage.    Acute hypoxia secondary to COVID-19 pneumonia:  --O2 requirement went from 6L to heated hf after presentation. --CRP 12.8 on presentation. PLAN: --cont Remdesivir --cont steroid as solumedrol 40 mg q8h --start baricitinib --start Combivent QID --Continue supplemental O2 to keep sats >=90%, wean as tolerated  Diabetes mellitus type 2 poorly controlled Hyperglycemia exacerbated by steroid --A1c 8.0 PLAN: --Hold home oral agent --start Levemir 10u BID --SSI TID  History of obstructive sleep apnea:  Noncompliant with CPAP at home. --Hold CPAP due to COVID   Hyperkalemia, resolved Etiology unclear. No EKG changes. Patient is asymptomatic.  --Monitor for now    DVT prophylaxis: Lovenox SQ Code Status: Full code  Family Communication:  Status is: inpatient Dispo:   The patient is from: home Anticipated d/c is to: home Anticipated d/c date is: >3 days Patient currently is not medically stable to d/c due to: heated hf   Subjective and Interval History:  Pt  reported dyspnea improved.  Having cough with sputum production.  No N/V/D.   Objective: Vitals:   08/29/20 1400 08/29/20 1500 08/29/20 1622 08/29/20 1623  BP: 119/74 127/71 119/73   Pulse: 67 (!) 58 69   Resp: (!) 22 16 19    Temp:  97.8 F (36.6 C) 97.8 F (36.6 C)   TempSrc:  Oral Oral   SpO2: 98% 99%    Weight:    91.5 kg  Height:    5\' 8"  (1.727 m)    Intake/Output Summary (Last 24 hours) at 08/29/2020 1850 Last data filed at 08/29/2020 1730 Gross per 24 hour  Intake 1751.98 ml  Output 1600 ml  Net 151.98 ml   Filed Weights   08/28/20 1457 08/29/20 1623  Weight: 99.8 kg 91.5 kg    Examination:   Constitutional: NAD, AAOx3 HEENT: conjunctivae and lids normal, EOMI CV: No cyanosis.   RESP: crackles over posterior bases, on heated hf Extremities: No effusions, edema in BLE SKIN: warm, dry and intact Neuro: II - XII grossly intact.   Psych: Normal mood and affect.  Appropriate judgement and reason   Data Reviewed: I have personally reviewed following labs and imaging studies  CBC: Recent Labs  Lab 08/28/20 1523 08/28/20 1839 08/29/20 0451  WBC 7.4 7.5 6.0  NEUTROABS  --   --  5.0  HGB 12.7* 13.2 13.5  HCT 36.3* 37.3* 38.3*  MCV 84.0 84.0 85.1  PLT 188 198 230   Basic Metabolic Panel: Recent Labs  Lab 08/28/20 1523 08/28/20 1839 08/29/20 0451  NA 130*  --  132*  K 5.5* 5.4* 5.1  CL 95*  --  97*  CO2 24  --  24  GLUCOSE 350*  --  338*  BUN 22*  --  24*  CREATININE 1.11  --  1.22  CALCIUM 7.9*  --  8.5*   GFR: Estimated Creatinine Clearance: 74.2 mL/min (by C-G formula based on SCr of 1.22 mg/dL). Liver Function Tests: Recent Labs  Lab 08/28/20 1523 08/29/20 0451  AST 37 37  ALT 19 22  ALKPHOS 52 57  BILITOT 0.6 0.8  PROT 7.0 7.4  ALBUMIN 3.1* 3.1*   No results for input(s): LIPASE, AMYLASE in the last 168 hours. No results for input(s): AMMONIA in the last 168 hours. Coagulation Profile: No results for input(s): INR, PROTIME in  the last 168 hours. Cardiac Enzymes: No results for input(s): CKTOTAL, CKMB, CKMBINDEX, TROPONINI in the last 168 hours. BNP (last 3 results) No results for input(s): PROBNP in the last 8760 hours. HbA1C: Recent Labs    08/28/20 1523  HGBA1C 8.0*   CBG: Recent Labs  Lab 08/28/20 1759 08/28/20 2119 08/29/20 0746 08/29/20 1147 08/29/20 1708  GLUCAP 273* 370* 271* 260* 334*   Lipid Profile: No results for input(s): CHOL, HDL, LDLCALC, TRIG, CHOLHDL, LDLDIRECT in the last 72 hours. Thyroid Function Tests: No results for input(s): TSH, T4TOTAL, FREET4, T3FREE, THYROIDAB in the last 72 hours. Anemia Panel: No results for input(s): VITAMINB12, FOLATE, FERRITIN, TIBC, IRON, RETICCTPCT in the last 72 hours. Sepsis Labs: Recent Labs  Lab 08/29/20 0451  PROCALCITON 0.10    Recent Results (from the past 240 hour(s))  Culture, blood (single) w Reflex to ID Panel     Status: None (Preliminary result)   Collection Time: 08/28/20  6:40 PM   Specimen: BLOOD  Result Value Ref Range Status   Specimen Description BLOOD BLOOD LEFT FOREARM  Final   Special Requests   Final    BOTTLES DRAWN AEROBIC AND ANAEROBIC Blood Culture adequate volume   Culture   Final    NO GROWTH < 12 HOURS Performed at [facility], [address] [place], [place] [address]    Report Status PENDING  Incomplete  Culture, sputum-assessment     Status: None   Collection Time: 08/29/20  4:51 AM   Specimen: Sputum  Result Value Ref Range Status   Specimen Description SPUTUM  Final   Special Requests NONE  Final   Sputum evaluation   Final    Sputum specimen not acceptable for testing.  Please recollect.   C/JESSICA COLTRANE AT 8413 08/29/20.PMF Performed at [facility], [address] [place], [place] [address]    Report Status 08/29/2020 FINAL  Final      Radiology Studies: DG Chest Portable 1 View  Result Date: 08/28/2020 CLINICAL DATA:  COVID positive with hypoxia. EXAM:  PORTABLE CHEST 1 VIEW COMPARISON:  Report from chest radiograph dated 08/25/2020. FINDINGS: The heart size is normal. Moderate patchy bilateral airspace opacities are noted. There is no pleural effusion or pneumothorax. The osseous structures are intact. IMPRESSION: Moderate patchy bilateral airspace opacities are consistent with COVID-19 pneumonia. Electronically Signed   By: Romona Curls M.D.   On: 08/28/2020 15:46     Scheduled Meds: . baricitinib  4 mg Oral Daily  . enoxaparin (LOVENOX) injection  50 mg Subcutaneous Q24H  . folic acid  1 mg Oral Daily  . gabapentin  100 mg Oral BID  . insulin aspart  0-15 Units Subcutaneous TID WC  . insulin detemir  10 Units Subcutaneous BID  .  Ipratropium-Albuterol  1 puff Inhalation QID  . meloxicam  15 mg Oral Daily  . methylPREDNISolone (SOLU-MEDROL) injection  40 mg Intravenous Q8H  . multivitamin with minerals  1 tablet Oral Daily  . pantoprazole  40 mg Oral Daily   Continuous Infusions: . remdesivir 100 mg in NS 100 mL Stopped (08/29/20 0955)     LOS: 1 day     Darlin Priestly, MD [facility] If 7PM-7AM, please contact night-coverage 08/29/2020, 6:50 PM

## 2020-08-29 NOTE — ED Notes (Signed)
Spoke with Katherina Right, NP to inform her that pt is now being put on heated high flow nasal cannula due to increasing O2 demand. Awaiting further orders. Will continue to monitor.

## 2020-08-29 NOTE — ED Notes (Signed)
Water provided per pt request. Lights dimmed and pt updated on treatment plan for the remainder of the night.

## 2020-08-30 LAB — C-REACTIVE PROTEIN: CRP: 5.2 mg/dL — ABNORMAL HIGH (ref <1.0)

## 2020-08-30 LAB — D-DIMER, QUANTITATIVE: D-Dimer, Quant: 3.34 ug/mL-FEU — ABNORMAL HIGH (ref 0.00–0.50)

## 2020-08-30 LAB — BASIC METABOLIC PANEL
Anion gap: 10 (ref 5–15)
BUN: 27 mg/dL — ABNORMAL HIGH (ref 6–20)
CO2: 23 mmol/L (ref 22–32)
Calcium: 8.4 mg/dL — ABNORMAL LOW (ref 8.9–10.3)
Chloride: 98 mmol/L (ref 98–111)
Creatinine, Ser: 1.02 mg/dL (ref 0.61–1.24)
GFR, Estimated: >60 mL/min (ref >60)
Glucose, Bld: 322 mg/dL — ABNORMAL HIGH (ref 70–99)
Potassium: 5.4 mmol/L — ABNORMAL HIGH (ref 3.5–5.1)
Sodium: 131 mmol/L — ABNORMAL LOW (ref 135–145)

## 2020-08-30 LAB — MAGNESIUM: Magnesium: 2.6 mg/dL — ABNORMAL HIGH (ref 1.7–2.4)

## 2020-08-30 LAB — CBC
HCT: 36.5 % — ABNORMAL LOW (ref 39.0–52.0)
Hemoglobin: 12.9 g/dL — ABNORMAL LOW (ref 13.0–17.0)
MCH: 29.9 pg (ref 26.0–34.0)
MCHC: 35.3 g/dL (ref 30.0–36.0)
MCV: 84.5 fL (ref 80.0–100.0)
Platelets: 251 10*3/uL (ref 150–400)
RBC: 4.32 MIL/uL (ref 4.22–5.81)
RDW: 11.1 % — ABNORMAL LOW (ref 11.5–15.5)
WBC: 8 10*3/uL (ref 4.0–10.5)
nRBC: 0 % (ref 0.0–0.2)

## 2020-08-30 LAB — GLUCOSE, CAPILLARY
Glucose-Capillary: 331 mg/dL — ABNORMAL HIGH (ref 70–99)
Glucose-Capillary: 291 mg/dL — ABNORMAL HIGH (ref 70–99)
Glucose-Capillary: 295 mg/dL — ABNORMAL HIGH (ref 70–99)
Glucose-Capillary: 330 mg/dL — ABNORMAL HIGH (ref 70–99)

## 2020-08-30 MED ORDER — ENOXAPARIN SODIUM 60 MG/0.6ML ~~LOC~~ SOLN
0.5000 mg/kg | SUBCUTANEOUS | Status: DC
  Administered 2020-08-30 – 2020-08-31 (×2): 45 mg via SUBCUTANEOUS
  Filled 2020-08-30 (×2): qty 0.6

## 2020-08-30 MED ORDER — INSULIN DETEMIR 100 UNIT/ML ~~LOC~~ SOLN
15.0000 [IU] | Freq: Two times a day (BID) | SUBCUTANEOUS | Status: DC
  Administered 2020-08-30 – 2020-09-03 (×10): 15 [IU] via SUBCUTANEOUS
  Filled 2020-08-30 (×11): qty 0.15

## 2020-08-30 MED ORDER — SODIUM POLYSTYRENE SULFONATE 15 GM/60ML PO SUSP
15.0000 g | Freq: Once | ORAL | Status: AC
  Administered 2020-08-30: 15 g via ORAL
  Filled 2020-08-30: qty 60

## 2020-08-30 MED ORDER — SODIUM CHLORIDE 0.9 % IV SOLN
INTRAVENOUS | Status: DC | PRN
  Administered 2020-08-30: 250 mL via INTRAVENOUS

## 2020-08-30 NOTE — Progress Notes (Deleted)
[employer]  Member id: [id number] Group: [id number] Plan: [id number]

## 2020-08-30 NOTE — Progress Notes (Addendum)
PROGRESS NOTE    Gregory Johnston  TRR:[id number] DOB: 04/07/1966 DOA: 08/28/2020 PCP: Barbette Reichmann, MD    Assessment & Plan:   Active Problems:   Acute respiratory disease due to COVID-19 virus    Gregory Johnston is a 54 y.o. male with medical history significant for type 2 diabetes mellitus and obstructive sleep apnea.  He is not up-to-date with his Covid vaccination and presented to his primary care physician on 08/25/2020 with complaints of symptoms of generalized malaise, cough and with shortness of breath after contact with wife tested positive for Covid.  Patient also tested positive on the day but was deemed stable for conservative management at home.  Today, patient presents due to worsening shortness of breath with minimal exertion.  Symptoms have been associated with nonproductive cough, fever and chills.  Patient was noted to be hypoxic on presentation and required 6 L/min of oxygen at triage.    Acute hypoxic respiratory failure --O2 requirement went from 6L to heated hf after presentation. PLAN: --Continue supplemental O2 to keep sats >=90%, wean as tolerated --treat COVID PNA  # COVID-19 pneumonia:  --CXR showed "Moderate patchy bilateral airspace opacities." --CRP 12.8 on presentation, started on solumedrol, CRP trending down PLAN: --cont Remdesivir --cont steroid as solumedrol 40 mg q8h --cont baricitinib --cont Combivent QID --Continue supplemental O2 to keep sats >=90%, wean as tolerated  Diabetes mellitus type 2 poorly controlled Hyperglycemia exacerbated by steroid --A1c 8.0 PLAN: --Hold home oral agent --increase Levemir to 15u BID --SSI TID  History of obstructive sleep apnea:  Noncompliant with CPAP at home. --Hold CPAP due to COVID   Hyperkalemia Etiology unclear. No EKG changes. Patient is asymptomatic.  --Kayexalate 15g x1 today   DVT prophylaxis: Lovenox SQ Code Status: Full code  Family Communication:  Status is:  inpatient Dispo:   The patient is from: home Anticipated d/c is to: home Anticipated d/c date is: >3 days Patient currently is not medically stable to d/c due to: heated hf   Subjective and Interval History:  Pt reported feeling better.  O2 requirement improving.   Objective: Vitals:   08/30/20 0438 08/30/20 0800 08/30/20 1200 08/30/20 1259  BP:  120/72 121/79   Pulse:  (!) 54 61   Resp:  19 17   Temp:  98.2 F (36.8 C) 98.4 F (36.9 C)   TempSrc:  Oral Oral   SpO2:  95% 99% 90%  Weight: 90.5 kg     Height:        Intake/Output Summary (Last 24 hours) at 08/30/2020 1422 Last data filed at 08/30/2020 1210 Gross per 24 hour  Intake 96.11 ml  Output 1950 ml  Net -1853.89 ml   Filed Weights   08/28/20 1457 08/29/20 1623 08/30/20 0438  Weight: 99.8 kg 91.5 kg 90.5 kg    Examination:   Constitutional: NAD, AAOx3 HEENT: conjunctivae and lids normal, EOMI CV: No cyanosis.   RESP: on heated hf, no distress Extremities: No effusions, edema in BLE SKIN: warm, dry and intact Neuro: II - XII grossly intact.   Psych: Normal mood and affect.  Appropriate judgement and reason    Data Reviewed: I have personally reviewed following labs and imaging studies  CBC: Recent Labs  Lab 08/28/20 1523 08/28/20 1839 08/29/20 0451 08/30/20 0636  WBC 7.4 7.5 6.0 8.0  NEUTROABS  --   --  5.0  --   HGB 12.7* 13.2 13.5 12.9*  HCT 36.3* 37.3* 38.3* 36.5*  MCV 84.0 84.0 85.1 84.5  PLT 188 198 230 251   Basic Metabolic Panel: Recent Labs  Lab 08/28/20 1523 08/28/20 1839 08/29/20 0451 08/30/20 0636  NA 130*  --  132* 131*  K 5.5* 5.4* 5.1 5.4*  CL 95*  --  97* 98  CO2 24  --  24 23  GLUCOSE 350*  --  338* 322*  BUN 22*  --  24* 27*  CREATININE 1.11  --  1.22 1.02  CALCIUM 7.9*  --  8.5* 8.4*  MG  --   --   --  2.6*   GFR: Estimated Creatinine Clearance: 88.3 mL/min (by C-G formula based on SCr of 1.02 mg/dL). Liver Function Tests: Recent Labs  Lab 08/28/20 1523  08/29/20 0451  AST 37 37  ALT 19 22  ALKPHOS 52 57  BILITOT 0.6 0.8  PROT 7.0 7.4  ALBUMIN 3.1* 3.1*   No results for input(s): LIPASE, AMYLASE in the last 168 hours. No results for input(s): AMMONIA in the last 168 hours. Coagulation Profile: No results for input(s): INR, PROTIME in the last 168 hours. Cardiac Enzymes: No results for input(s): CKTOTAL, CKMB, CKMBINDEX, TROPONINI in the last 168 hours. BNP (last 3 results) No results for input(s): PROBNP in the last 8760 hours. HbA1C: Recent Labs    08/28/20 1523  HGBA1C 8.0*   CBG: Recent Labs  Lab 08/29/20 1147 08/29/20 1708 08/29/20 2237 08/30/20 0813 08/30/20 1207  GLUCAP 260* 334* 292* 331* 291*   Lipid Profile: No results for input(s): CHOL, HDL, LDLCALC, TRIG, CHOLHDL, LDLDIRECT in the last 72 hours. Thyroid Function Tests: No results for input(s): TSH, T4TOTAL, FREET4, T3FREE, THYROIDAB in the last 72 hours. Anemia Panel: No results for input(s): VITAMINB12, FOLATE, FERRITIN, TIBC, IRON, RETICCTPCT in the last 72 hours. Sepsis Labs: Recent Labs  Lab 08/29/20 0451  PROCALCITON 0.10    Recent Results (from the past 240 hour(s))  Culture, blood (single) w Reflex to ID Panel     Status: None (Preliminary result)   Collection Time: 08/28/20  6:40 PM   Specimen: BLOOD  Result Value Ref Range Status   Specimen Description BLOOD BLOOD LEFT FOREARM  Final   Special Requests   Final    BOTTLES DRAWN AEROBIC AND ANAEROBIC Blood Culture adequate volume   Culture   Final    NO GROWTH 2 DAYS Performed at [facility], [address] [place], [place] [address]    Report Status PENDING  Incomplete  Culture, sputum-assessment     Status: None   Collection Time: 08/29/20  4:51 AM   Specimen: Sputum  Result Value Ref Range Status   Specimen Description SPUTUM  Final   Special Requests NONE  Final   Sputum evaluation   Final    Sputum specimen not acceptable for testing.  Please recollect.   C/JESSICA  COLTRANE AT 3244 08/29/20.PMF Performed at [facility], [address] [place], [place] [address]    Report Status 08/29/2020 FINAL  Final      Radiology Studies: DG Chest Portable 1 View  Result Date: 08/28/2020 CLINICAL DATA:  COVID positive with hypoxia. EXAM: PORTABLE CHEST 1 VIEW COMPARISON:  Report from chest radiograph dated 08/25/2020. FINDINGS: The heart size is normal. Moderate patchy bilateral airspace opacities are noted. There is no pleural effusion or pneumothorax. The osseous structures are intact. IMPRESSION: Moderate patchy bilateral airspace opacities are consistent with COVID-19 pneumonia. Electronically Signed   By: Romona Curls M.D.   On: 08/28/2020 15:46     Scheduled  Meds: . baricitinib  4 mg Oral Daily  . enoxaparin (LOVENOX) injection  0.5 mg/kg Subcutaneous Q24H  . folic acid  1 mg Oral Daily  . gabapentin  100 mg Oral BID  . insulin aspart  0-15 Units Subcutaneous TID WC  . insulin detemir  15 Units Subcutaneous BID  . Ipratropium-Albuterol  1 puff Inhalation QID  . meloxicam  15 mg Oral Daily  . methylPREDNISolone (SOLU-MEDROL) injection  40 mg Intravenous Q8H  . multivitamin with minerals  1 tablet Oral Daily  . pantoprazole  40 mg Oral Daily   Continuous Infusions: . sodium chloride 250 mL (08/30/20 0930)  . remdesivir 100 mg in NS 100 mL 100 mg (08/30/20 0931)     LOS: 2 days     Darlin Priestly, MD [facility] If 7PM-7AM, please contact night-coverage 08/30/2020, 2:22 PM

## 2020-08-30 NOTE — Progress Notes (Signed)
Inpatient Diabetes Program Recommendations  AACE/ADA: New Consensus Statement on Inpatient Glycemic Control (2015)  Target Ranges:  Prepandial:   less than 140 mg/dL      Peak postprandial:   less than 180 mg/dL (1-2 hours)      Critically ill patients:  140 - 180 mg/dL   Lab Results  Component Value Date   GLUCAP 331 (H) 08/30/2020   HGBA1C 8.0 (H) 08/28/2020    Review of Glycemic Control Results for Gregory Johnston, Gregory Johnston (MRN [id number]) as of 08/30/2020 09:23  Ref. Range 08/29/2020 07:46 08/29/2020 11:47 08/29/2020 17:08 08/29/2020 22:37 08/30/2020 08:13  Glucose-Capillary Latest Ref Range: 70 - 99 mg/dL 620 (H) 355 (H) 974 (H) 292 (H) 331 (H)   Diabetes history: DM2        Outpatient Diabetes medications: Amaryl 1 mg qd + Metformin 1 gm bid Current orders for Inpatient glycemic control: Levemir 10 units bid + Novolog moderate 0-15 tid + Solumedrol 40 mg q 8 hrs.  Inpatient Diabetes Program Recommendations:   While oral DM meds held and on steroids: -Add Novolog 4 units tid meal coverage if eats 50% -Add Novolog correction 0-5 units q hs Secure chat sent to Dr. Fran Lowes.  Thank you, Gregory Johnston. Gregory Aslin, RN, MSN, CDE  Diabetes Coordinator Inpatient Glycemic Control Team Team Pager [phone] (8am-5pm) 08/30/2020 9:26 AM

## 2020-08-30 NOTE — Progress Notes (Signed)
Wife called to ask about patient receiving antibodies he was scheduled for today as outpatient prior to being admitted. Secure chatted MD Fran Lowes to inquire. She educated me that patient's are not eligible for antibodies once they are inpatient receiving treatment for Covid infection. I will update wife and patient on information.

## 2020-08-31 LAB — BASIC METABOLIC PANEL
Anion gap: 12 (ref 5–15)
BUN: 26 mg/dL — ABNORMAL HIGH (ref 6–20)
CO2: 24 mmol/L (ref 22–32)
Calcium: 8.4 mg/dL — ABNORMAL LOW (ref 8.9–10.3)
Chloride: 97 mmol/L — ABNORMAL LOW (ref 98–111)
Creatinine, Ser: 0.95 mg/dL (ref 0.61–1.24)
GFR, Estimated: >60 mL/min (ref >60)
Glucose, Bld: 271 mg/dL — ABNORMAL HIGH (ref 70–99)
Potassium: 4.7 mmol/L (ref 3.5–5.1)
Sodium: 133 mmol/L — ABNORMAL LOW (ref 135–145)

## 2020-08-31 LAB — GLUCOSE, CAPILLARY
Glucose-Capillary: 265 mg/dL — ABNORMAL HIGH (ref 70–99)
Glucose-Capillary: 264 mg/dL — ABNORMAL HIGH (ref 70–99)
Glucose-Capillary: 232 mg/dL — ABNORMAL HIGH (ref 70–99)
Glucose-Capillary: 231 mg/dL — ABNORMAL HIGH (ref 70–99)

## 2020-08-31 LAB — CBC
HCT: 37.8 % — ABNORMAL LOW (ref 39.0–52.0)
Hemoglobin: 13.4 g/dL (ref 13.0–17.0)
MCH: 29.5 pg (ref 26.0–34.0)
MCHC: 35.4 g/dL (ref 30.0–36.0)
MCV: 83.3 fL (ref 80.0–100.0)
Platelets: 313 10*3/uL (ref 150–400)
RBC: 4.54 MIL/uL (ref 4.22–5.81)
RDW: 11.1 % — ABNORMAL LOW (ref 11.5–15.5)
WBC: 8.7 10*3/uL (ref 4.0–10.5)
nRBC: 0 % (ref 0.0–0.2)

## 2020-08-31 LAB — C-REACTIVE PROTEIN: CRP: 1.8 mg/dL — ABNORMAL HIGH (ref <1.0)

## 2020-08-31 LAB — MAGNESIUM: Magnesium: 2.6 mg/dL — ABNORMAL HIGH (ref 1.7–2.4)

## 2020-08-31 NOTE — Progress Notes (Signed)
PROGRESS NOTE    Gregory Johnston  FTD:[id number] DOB: 1965/12/31 DOA: 08/28/2020 PCP: Barbette Reichmann, MD    Assessment & Plan:   Active Problems:   Acute respiratory disease due to COVID-19 virus    Gregory Johnston is a 54 y.o. male with medical history significant for type 2 diabetes mellitus and obstructive sleep apnea.  He is not up-to-date with his Covid vaccination and presented to his primary care physician on 08/25/2020 with complaints of symptoms of generalized malaise, cough and with shortness of breath after contact with wife tested positive for Covid.  Patient also tested positive on the day but was deemed stable for conservative management at home.  Today, patient presents due to worsening shortness of breath with minimal exertion.  Symptoms have been associated with nonproductive cough, fever and chills.  Patient was noted to be hypoxic on presentation and required 6 L/min of oxygen at triage.    Acute hypoxic respiratory failure --O2 requirement went from 6L to heated hf after presentation. PLAN: --Continue supplemental O2 to keep sats >=88%, wean as tolerated --treat COVID PNA  # COVID-19 pneumonia:  --CXR showed "Moderate patchy bilateral airspace opacities." --CRP 12.8 on presentation, started on solumedrol, CRP trending down PLAN: --cont Remdesivir --taper steroid from solumedrol to prednisone 50 mg BID --Ensure CRP down-trending with enough steroid --cont baricitinib --cont Combivent QID  Diabetes mellitus type 2 poorly controlled Hyperglycemia exacerbated by steroid --A1c 8.0 PLAN: --Hold home oral agent --cont Levemir 15u BID --add mealtime 4u TID --SSI TID  History of obstructive sleep apnea:  Noncompliant with CPAP at home. --Hold CPAP due to COVID   Hyperkalemia Etiology unclear. No EKG changes. Patient is asymptomatic.  --s/p Kayexalate 15g x1  --Monitor and treat PRN   DVT prophylaxis: Lovenox SQ Code Status: Full code  Family  Communication:  Status is: inpatient Dispo:   The patient is from: home Anticipated d/c is to: home Anticipated d/c date is: >3 days Patient currently is not medically stable to d/c due to: heated hf   Subjective and Interval History:  O2 requirement increased on heated hf.  Pt reported feeling about the same, no significant dyspnea.  Normal oral intake.   Objective: Vitals:   08/31/20 0816 08/31/20 0819 08/31/20 1140 08/31/20 1412  BP: 113/70  107/62   Pulse: 64  70   Resp: 19  (!) 21   Temp:   98.1 F (36.7 C)   TempSrc: Oral  Oral   SpO2:  93% 98% 96%  Weight:      Height:        Intake/Output Summary (Last 24 hours) at 08/31/2020 1518 Last data filed at 08/31/2020 1139 Gross per 24 hour  Intake 382.7 ml  Output 2475 ml  Net -2092.3 ml   Filed Weights   08/29/20 1623 08/30/20 0438 08/31/20 0458  Weight: 91.5 kg 90.5 kg 91.6 kg    Examination:   Constitutional: NAD, AAOx3 HEENT: conjunctivae and lids normal, EOMI CV: No cyanosis.   RESP: crackles, on heated hf Extremities: No effusions, edema in BLE SKIN: warm, dry and intact Neuro: II - XII grossly intact.   Psych: Normal mood and affect.  Appropriate judgement and reason    Data Reviewed: I have personally reviewed following labs and imaging studies  CBC: Recent Labs  Lab 08/28/20 1523 08/28/20 1839 08/29/20 0451 08/30/20 0636 08/31/20 0531  WBC 7.4 7.5 6.0 8.0 8.7  NEUTROABS  --   --  5.0  --   --  HGB 12.7* 13.2 13.5 12.9* 13.4  HCT 36.3* 37.3* 38.3* 36.5* 37.8*  MCV 84.0 84.0 85.1 84.5 83.3  PLT 188 198 230 251 313   Basic Metabolic Panel: Recent Labs  Lab 08/28/20 1523 08/28/20 1839 08/29/20 0451 08/30/20 0636 08/31/20 0531  NA 130*  --  132* 131* 133*  K 5.5* 5.4* 5.1 5.4* 4.7  CL 95*  --  97* 98 97*  CO2 24  --  24 23 24   GLUCOSE 350*  --  338* 322* 271*  BUN 22*  --  24* 27* 26*  CREATININE 1.11  --  1.22 1.02 0.95  CALCIUM 7.9*  --  8.5* 8.4* 8.4*  MG  --   --   --   2.6* 2.6*   GFR: Estimated Creatinine Clearance: 95.4 mL/min (by C-G formula based on SCr of 0.95 mg/dL). Liver Function Tests: Recent Labs  Lab 08/28/20 1523 08/29/20 0451  AST 37 37  ALT 19 22  ALKPHOS 52 57  BILITOT 0.6 0.8  PROT 7.0 7.4  ALBUMIN 3.1* 3.1*   No results for input(s): LIPASE, AMYLASE in the last 168 hours. No results for input(s): AMMONIA in the last 168 hours. Coagulation Profile: No results for input(s): INR, PROTIME in the last 168 hours. Cardiac Enzymes: No results for input(s): CKTOTAL, CKMB, CKMBINDEX, TROPONINI in the last 168 hours. BNP (last 3 results) No results for input(s): PROBNP in the last 8760 hours. HbA1C: Recent Labs    08/28/20 1523  HGBA1C 8.0*   CBG: Recent Labs  Lab 08/30/20 1207 08/30/20 1651 08/30/20 2024 08/31/20 0821 08/31/20 1138  GLUCAP 291* 295* 330* 265* 264*   Lipid Profile: No results for input(s): CHOL, HDL, LDLCALC, TRIG, CHOLHDL, LDLDIRECT in the last 72 hours. Thyroid Function Tests: No results for input(s): TSH, T4TOTAL, FREET4, T3FREE, THYROIDAB in the last 72 hours. Anemia Panel: No results for input(s): VITAMINB12, FOLATE, FERRITIN, TIBC, IRON, RETICCTPCT in the last 72 hours. Sepsis Labs: Recent Labs  Lab 08/29/20 0451  PROCALCITON 0.10    Recent Results (from the past 240 hour(s))  Culture, blood (single) w Reflex to ID Panel     Status: None (Preliminary result)   Collection Time: 08/28/20  6:40 PM   Specimen: BLOOD  Result Value Ref Range Status   Specimen Description BLOOD BLOOD LEFT FOREARM  Final   Special Requests   Final    BOTTLES DRAWN AEROBIC AND ANAEROBIC Blood Culture adequate volume   Culture   Final    NO GROWTH 3 DAYS Performed at [facility], [address], [place], [place]    Report Status PENDING  Incomplete  Culture, sputum-assessment     Status: None   Collection Time: 08/29/20  4:51 AM   Specimen: Sputum  Result Value Ref Range Status   Specimen  Description SPUTUM  Final   Special Requests NONE  Final   Sputum evaluation   Final    Sputum specimen not acceptable for testing.  Please recollect.   C/JESSICA COLTRANE AT 08/31/20 08/29/20.PMF Performed at [facility], [address], [place], [place]    Report Status 08/29/2020 FINAL  Final      Radiology Studies: No results found.   Scheduled Meds: . baricitinib  4 mg Oral Daily  . enoxaparin (LOVENOX) injection  0.5 mg/kg Subcutaneous Q24H  . folic acid  1 mg Oral Daily  . gabapentin  100 mg Oral BID  . insulin aspart  0-15 Units Subcutaneous TID WC  .  insulin detemir  15 Units Subcutaneous BID  . Ipratropium-Albuterol  1 puff Inhalation QID  . meloxicam  15 mg Oral Daily  . methylPREDNISolone (SOLU-MEDROL) injection  40 mg Intravenous Q8H  . multivitamin with minerals  1 tablet Oral Daily  . pantoprazole  40 mg Oral Daily   Continuous Infusions: . sodium chloride Stopped (08/30/20 1217)  . remdesivir 100 mg in NS 100 mL 100 mg (08/31/20 0902)     LOS: 3 days     Darlin Priestly, MD [facility] If 7PM-7AM, please contact night-coverage 08/31/2020, 3:18 PM

## 2020-08-31 NOTE — Progress Notes (Signed)
Inpatient Diabetes Program Recommendations  AACE/ADA: New Consensus Statement on Inpatient Glycemic Control (2015)  Target Ranges:  Prepandial:   less than 140 mg/dL      Peak postprandial:   less than 180 mg/dL (1-2 hours)      Critically ill patients:  140 - 180 mg/dL   Lab Results  Component Value Date   GLUCAP 265 (H) 08/31/2020   HGBA1C 8.0 (H) 08/28/2020    Review of Glycemic Control Results for Gregory Johnston, Gregory Johnston (MRN [id number]) as of 08/31/2020 10:27  Ref. Range 08/30/2020 08:13 08/30/2020 12:07 08/30/2020 16:51 08/30/2020 20:24 08/31/2020 08:21  Glucose-Capillary Latest Ref Range: 70 - 99 mg/dL 712 (H) 458 (H) 099 (H) 330 (H) 265 (H)   Diabetes history: DM2                                                                                     Outpatient Diabetes medications: Amaryl 1 mg qd + Metformin 1 gm bid Current orders for Inpatient glycemic control: Levemir 15 units bid + Novolog moderate 0-15 tid + Solumedrol 40 mg q 8 hrs.  Inpatient Diabetes Program Recommendations:   CBGs continue to be elevated 265-331 While oral DM meds held and on current dose of steroids: -Add Novolog 4 units tid meal coverage if eats 50% -Add Novolog correction 0-5 units q hs Secure chat sent to Dr. Fran Lowes.  Thank you, [name]. Annebelle Bostic, RN, MSN, CDE  Diabetes Coordinator Inpatient Glycemic Control Team Team Pager [phone] (8am-5pm) 08/31/2020 10:29 AM

## 2020-09-01 LAB — CBC
HCT: 39.9 % (ref 39.0–52.0)
Hemoglobin: 14.1 g/dL (ref 13.0–17.0)
MCH: 29.7 pg (ref 26.0–34.0)
MCHC: 35.3 g/dL (ref 30.0–36.0)
MCV: 84 fL (ref 80.0–100.0)
Platelets: 280 10*3/uL (ref 150–400)
RBC: 4.75 MIL/uL (ref 4.22–5.81)
RDW: 11.2 % — ABNORMAL LOW (ref 11.5–15.5)
WBC: 9.4 10*3/uL (ref 4.0–10.5)
nRBC: 0 % (ref 0.0–0.2)

## 2020-09-01 LAB — BASIC METABOLIC PANEL
Anion gap: 9 (ref 5–15)
BUN: 24 mg/dL — ABNORMAL HIGH (ref 6–20)
CO2: 27 mmol/L (ref 22–32)
Calcium: 8.2 mg/dL — ABNORMAL LOW (ref 8.9–10.3)
Chloride: 96 mmol/L — ABNORMAL LOW (ref 98–111)
Creatinine, Ser: 1.07 mg/dL (ref 0.61–1.24)
GFR, Estimated: >60 mL/min (ref >60)
Glucose, Bld: 214 mg/dL — ABNORMAL HIGH (ref 70–99)
Potassium: 4.9 mmol/L (ref 3.5–5.1)
Sodium: 132 mmol/L — ABNORMAL LOW (ref 135–145)

## 2020-09-01 LAB — C-REACTIVE PROTEIN: CRP: 2.9 mg/dL — ABNORMAL HIGH (ref <1.0)

## 2020-09-01 LAB — GLUCOSE, CAPILLARY
Glucose-Capillary: 198 mg/dL — ABNORMAL HIGH (ref 70–99)
Glucose-Capillary: 170 mg/dL — ABNORMAL HIGH (ref 70–99)
Glucose-Capillary: 160 mg/dL — ABNORMAL HIGH (ref 70–99)
Glucose-Capillary: 223 mg/dL — ABNORMAL HIGH (ref 70–99)

## 2020-09-01 LAB — MAGNESIUM: Magnesium: 2.3 mg/dL (ref 1.7–2.4)

## 2020-09-01 MED ORDER — METHYLPREDNISOLONE SODIUM SUCC 125 MG IJ SOLR
80.0000 mg | Freq: Two times a day (BID) | INTRAMUSCULAR | Status: DC
  Administered 2020-09-01 – 2020-09-02 (×3): 80 mg via INTRAVENOUS
  Filled 2020-09-01 (×4): qty 2

## 2020-09-01 MED ORDER — PREDNISONE 50 MG PO TABS
50.0000 mg | ORAL_TABLET | Freq: Two times a day (BID) | ORAL | Status: DC
  Administered 2020-09-01: 50 mg via ORAL
  Filled 2020-09-01: qty 1

## 2020-09-01 MED ORDER — ENOXAPARIN SODIUM 60 MG/0.6ML ~~LOC~~ SOLN
0.5000 mg/kg | SUBCUTANEOUS | Status: DC
  Administered 2020-09-01: 45 mg via SUBCUTANEOUS
  Filled 2020-09-01: qty 0.6

## 2020-09-01 MED ORDER — INSULIN ASPART 100 UNIT/ML ~~LOC~~ SOLN
4.0000 [IU] | Freq: Three times a day (TID) | SUBCUTANEOUS | Status: DC
  Administered 2020-09-01 – 2020-09-03 (×9): 4 [IU] via SUBCUTANEOUS
  Filled 2020-09-01 (×9): qty 1

## 2020-09-01 NOTE — Plan of Care (Signed)
Pt resting in bed comfortably He appears to be in no apparent distress RT to assess for weaning O2 as needed Assessment completed as charted.  Pt tolerating all meals.  Pt educated on the benefits of proning. Pt able to prone all of this shift and O2 able to be decreased. Tolerated well Provider notified of numbness to R hand. The last two digits only. The rest of arm has full sensation.  No new orders received at this time. Provider encouraged making fist and releasing to help return sensation. Pt tolerated.  Wife called and updated Pt denies any additional wants or needs at this time Verbalizes an understanding on plan of care Call bell within reach Will continue to closely monitor.    Problem: Education: Goal: Knowledge of General Education information will improve Description: Including pain rating scale, medication(s)/side effects and non-pharmacologic comfort measures 09/01/2020 1919 by Olin Pia, RN Outcome: Progressing 09/01/2020 1902 by Olin Pia, RN Outcome: Progressing   Problem: Health Behavior/Discharge Planning: Goal: Ability to manage health-related needs will improve 09/01/2020 1919 by Olin Pia, RN Outcome: Progressing 09/01/2020 1902 by Olin Pia, RN Outcome: Progressing   Problem: Clinical Measurements: Goal: Ability to maintain clinical measurements within normal limits will improve 09/01/2020 1919 by Olin Pia, RN Outcome: Progressing 09/01/2020 1902 by Olin Pia, RN Outcome: Progressing Goal: Will remain free from infection 09/01/2020 1919 by Olin Pia, RN Outcome: Progressing 09/01/2020 1902 by Olin Pia, RN Outcome: Progressing Goal: Diagnostic test results will improve 09/01/2020 1919 by Olin Pia, RN Outcome: Progressing 09/01/2020 1902 by Olin Pia, RN Outcome: Progressing Goal: Respiratory complications will improve 09/01/2020 1919 by Olin Pia,  RN Outcome: Progressing 09/01/2020 1902 by Olin Pia, RN Outcome: Progressing Goal: Cardiovascular complication will be avoided 09/01/2020 1919 by Olin Pia, RN Outcome: Progressing 09/01/2020 1902 by Olin Pia, RN Outcome: Progressing   Problem: Activity: Goal: Risk for activity intolerance will decrease 09/01/2020 1919 by Olin Pia, RN Outcome: Progressing 09/01/2020 1902 by Olin Pia, RN Outcome: Progressing   Problem: Nutrition: Goal: Adequate nutrition will be maintained 09/01/2020 1919 by Olin Pia, RN Outcome: Progressing 09/01/2020 1902 by Olin Pia, RN Outcome: Progressing   Problem: Coping: Goal: Level of anxiety will decrease 09/01/2020 1919 by Olin Pia, RN Outcome: Progressing 09/01/2020 1902 by Olin Pia, RN Outcome: Progressing   Problem: Elimination: Goal: Will not experience complications related to bowel motility 09/01/2020 1919 by Olin Pia, RN Outcome: Progressing 09/01/2020 1902 by Olin Pia, RN Outcome: Progressing Goal: Will not experience complications related to urinary retention 09/01/2020 1919 by Olin Pia, RN Outcome: Progressing 09/01/2020 1902 by Olin Pia, RN Outcome: Progressing   Problem: Pain Managment: Goal: General experience of comfort will improve 09/01/2020 1919 by Olin Pia, RN Outcome: Progressing 09/01/2020 1902 by Olin Pia, RN Outcome: Progressing   Problem: Safety: Goal: Ability to remain free from injury will improve 09/01/2020 1919 by Olin Pia, RN Outcome: Progressing 09/01/2020 1902 by Olin Pia, RN Outcome: Progressing   Problem: Skin Integrity: Goal: Risk for impaired skin integrity will decrease 09/01/2020 1919 by Olin Pia, RN Outcome: Progressing 09/01/2020 1902 by Olin Pia, RN Outcome: Progressing   Problem: Education: Goal:  Knowledge of risk factors and measures for prevention of condition will improve 09/01/2020 1919 by Olin Pia, RN Outcome: Progressing 09/01/2020 1902 by Olin Pia, RN Outcome: Progressing  Problem: Coping: Goal: Psychosocial and spiritual needs will be supported 09/01/2020 1919 by Olin Pia, RN Outcome: Progressing 09/01/2020 1902 by Olin Pia, RN Outcome: Progressing   Problem: Respiratory: Goal: Will maintain a patent airway 09/01/2020 1919 by Olin Pia, RN Outcome: Progressing 09/01/2020 1902 by Olin Pia, RN Outcome: Progressing Goal: Complications related to the disease process, condition or treatment will be avoided or minimized 09/01/2020 1919 by Olin Pia, RN Outcome: Progressing 09/01/2020 1902 by Olin Pia, RN Outcome: Progressing

## 2020-09-01 NOTE — Plan of Care (Signed)

## 2020-09-01 NOTE — Progress Notes (Addendum)
PROGRESS NOTE    Gregory Johnston  CXK:[id number] DOB: 09/27/1966 DOA: 08/28/2020 PCP: Barbette Reichmann, MD   Brief Narrative: Gregory Johnston a 54 y.o.malewith medical history significant for type 2 diabetes mellitus and obstructive sleep apnea. He is not up-to-date with his Covid vaccination and presented to his primary care physician on 08/25/2020 with complaints of symptoms of generalized malaise, cough and with shortness of breath after contact with wife tested positive for Covid. Patient also tested positive on the daybut was deemed stable for conservative management at home.Today, patient presents due to worsening shortness of breath with minimalexertion.Symptoms have been associated with nonproductive cough,fever and chills. Patient was noted to be hypoxic on presentation and required 6 L/min of oxygen at triage.   As of 09/01/2020 patient remains dependent on heated high flow nasal cannula 60 L.  He has normal work of breathing.  He is mentating clearly.  Assessment & Plan:   Active Problems:   Acute respiratory disease due to COVID-19 virus  Multifocal pneumonia due to COVID-19 Acute hypoxic respiratory failure -Oxygen requirement on presentation is acutely worsened to 60 L -Elevated inflammatory markers and multifocal opacities consistent with known diagnosis -Normal work of breathing, intact mentation PLAN: Continue remdesivir, day 4/5 Continue baricitinib, day 4/14 Continue steroids, changed back to IV Solu-Medrol Oxygen, wean as tolerated Stressed I-S and flutter use Prone as tolerated No Lasix today  Diabetes mellitus type 2 poorly controlled Hyperglycemia exacerbated by steroid --A1c 8.0 PLAN: --Hold home oral agent --cont Levemir 15u BID --add mealtime 4u TID --SSI TID  History of obstructive sleep apnea: Noncompliant with CPAP at home. --Hold CPAP due to COVID   Hyperkalemia, resolved Etiology unclear. No EKG changes. Patient is  asymptomatic.  --s/p Kayexalate 15g x1  --Monitor and treat PRN   DVT prophylaxis: Lovenox Code Status: Full code Family Communication: Wife [name] via phone [phone] 09/01/2020 Disposition Plan: Status is: Inpatient  Remains inpatient appropriate because:Inpatient level of care appropriate due to severity of illness   Dispo: The patient is from: Home              Anticipated d/c is to: Home              Anticipated d/c date is: 3 days              Patient currently is not medically stable to d/c.  Patient remains dependent on heated high flow nasal cannula.  Normal work of breathing.  Intact mentation.  Suspect several additional days prior to disposition planning.        Consultants:   None  Procedures:  None Antimicrobials:  Remdesivir   Subjective: Patient seen and examined.  Remains on heated high flow.  No appreciable change in symptoms over interval.  No pain complaints.  Not significantly dyspneic.  Appears fatigued.  Objective: Vitals:   09/01/20 0614 09/01/20 0818 09/01/20 0856 09/01/20 1155  BP:  117/61  (!) 127/57  Pulse:  68  64  Resp:  19  16  Temp:  97.7 F (36.5 C)  97.8 F (36.6 C)  TempSrc:  Oral  Oral  SpO2:  90% 90% 99%  Weight: 89.3 kg     Height:        Intake/Output Summary (Last 24 hours) at 09/01/2020 1440 Last data filed at 09/01/2020 1201 Gross per 24 hour  Intake --  Output 1650 ml  Net -1650 ml   Filed Weights   08/30/20 0438 08/31/20 0458 09/01/20 5027  Weight: 90.5 kg 91.6 kg 89.3 kg    Examination:  General exam: Appears calm and comfortable  Respiratory system: Bilateral coarse crackles.  Normal work of breathing.  60 L Cardiovascular system: S1 & S2 heard, RRR. No JVD, murmurs, rubs, gallops or clicks. No pedal edema. Gastrointestinal system: Abdomen is nondistended, soft and nontender. No organomegaly or masses felt. Normal bowel sounds heard. Central nervous system: Alert and oriented. No focal  neurological deficits. Extremities: Symmetric 5 x 5 power. Skin: No rashes, lesions or ulcers Psychiatry: Judgement and insight appear normal. Mood & affect appropriate.     Data Reviewed: I have personally reviewed following labs and imaging studies  CBC: Recent Labs  Lab 08/28/20 1839 08/29/20 0451 08/30/20 0636 08/31/20 0531 09/01/20 0629  WBC 7.5 6.0 8.0 8.7 9.4  NEUTROABS  --  5.0  --   --   --   HGB 13.2 13.5 12.9* 13.4 14.1  HCT 37.3* 38.3* 36.5* 37.8* 39.9  MCV 84.0 85.1 84.5 83.3 84.0  PLT 198 230 251 313 280   Basic Metabolic Panel: Recent Labs  Lab 08/28/20 1523 08/28/20 1523 08/28/20 1839 08/29/20 0451 08/30/20 0636 08/31/20 0531 09/01/20 0629  NA 130*  --   --  132* 131* 133* 132*  K 5.5*   < > 5.4* 5.1 5.4* 4.7 4.9  CL 95*  --   --  97* 98 97* 96*  CO2 24  --   --  24 23 24 27   GLUCOSE 350*  --   --  338* 322* 271* 214*  BUN 22*  --   --  24* 27* 26* 24*  CREATININE 1.11  --   --  1.22 1.02 0.95 1.07  CALCIUM 7.9*  --   --  8.5* 8.4* 8.4* 8.2*  MG  --   --   --   --  2.6* 2.6* 2.3   < > = values in this interval not displayed.   GFR: Estimated Creatinine Clearance: 83.7 mL/min (by C-G formula based on SCr of 1.07 mg/dL). Liver Function Tests: Recent Labs  Lab 08/28/20 1523 08/29/20 0451  AST 37 37  ALT 19 22  ALKPHOS 52 57  BILITOT 0.6 0.8  PROT 7.0 7.4  ALBUMIN 3.1* 3.1*   No results for input(s): LIPASE, AMYLASE in the last 168 hours. No results for input(s): AMMONIA in the last 168 hours. Coagulation Profile: No results for input(s): INR, PROTIME in the last 168 hours. Cardiac Enzymes: No results for input(s): CKTOTAL, CKMB, CKMBINDEX, TROPONINI in the last 168 hours. BNP (last 3 results) No results for input(s): PROBNP in the last 8760 hours. HbA1C: No results for input(s): HGBA1C in the last 72 hours. CBG: Recent Labs  Lab 08/31/20 1138 08/31/20 1639 08/31/20 2220 09/01/20 0851 09/01/20 1157  GLUCAP 264* 232* 231* 198*  170*   Lipid Profile: No results for input(s): CHOL, HDL, LDLCALC, TRIG, CHOLHDL, LDLDIRECT in the last 72 hours. Thyroid Function Tests: No results for input(s): TSH, T4TOTAL, FREET4, T3FREE, THYROIDAB in the last 72 hours. Anemia Panel: No results for input(s): VITAMINB12, FOLATE, FERRITIN, TIBC, IRON, RETICCTPCT in the last 72 hours. Sepsis Labs: Recent Labs  Lab 08/29/20 0451  PROCALCITON 0.10    Recent Results (from the past 240 hour(s))  Culture, blood (single) w Reflex to ID Panel     Status: None (Preliminary result)   Collection Time: 08/28/20  6:40 PM   Specimen: BLOOD  Result Value Ref Range Status   Specimen Description BLOOD BLOOD  LEFT FOREARM  Final   Special Requests   Final    BOTTLES DRAWN AEROBIC AND ANAEROBIC Blood Culture adequate volume   Culture   Final    NO GROWTH 4 DAYS Performed at [facility], [address] [place], [place] [address]    Report Status PENDING  Incomplete  Culture, sputum-assessment     Status: None   Collection Time: 08/29/20  4:51 AM   Specimen: Sputum  Result Value Ref Range Status   Specimen Description SPUTUM  Final   Special Requests NONE  Final   Sputum evaluation   Final    Sputum specimen not acceptable for testing.  Please recollect.   C/JESSICA COLTRANE AT 4196 08/29/20.PMF Performed at [facility], [address] [place], [place] [address]    Report Status 08/29/2020 FINAL  Final         Radiology Studies: No results found.      Scheduled Meds: . baricitinib  4 mg Oral Daily  . enoxaparin (LOVENOX) injection  0.5 mg/kg Subcutaneous Q24H  . folic acid  1 mg Oral Daily  . gabapentin  100 mg Oral BID  . insulin aspart  0-15 Units Subcutaneous TID WC  . insulin aspart  4 Units Subcutaneous TID WC  . insulin detemir  15 Units Subcutaneous BID  . Ipratropium-Albuterol  1 puff Inhalation QID  . meloxicam  15 mg Oral Daily  . methylPREDNISolone (SOLU-MEDROL) injection  80 mg  Intravenous Q12H  . multivitamin with minerals  1 tablet Oral Daily  . pantoprazole  40 mg Oral Daily   Continuous Infusions: . sodium chloride Stopped (08/30/20 1217)     LOS: 4 days    Time spent: 25 minutes    Tresa Moore, MD [facility] Pager [phone]  If 7PM-7AM, please contact night-coverage 09/01/2020, 2:40 PM

## 2020-09-02 LAB — C-REACTIVE PROTEIN: CRP: 6.3 mg/dL — ABNORMAL HIGH (ref <1.0)

## 2020-09-02 LAB — CULTURE, BLOOD (SINGLE)
Culture: NO GROWTH
Special Requests: ADEQUATE

## 2020-09-02 LAB — GLUCOSE, CAPILLARY
Glucose-Capillary: 251 mg/dL — ABNORMAL HIGH (ref 70–99)
Glucose-Capillary: 210 mg/dL — ABNORMAL HIGH (ref 70–99)
Glucose-Capillary: 154 mg/dL — ABNORMAL HIGH (ref 70–99)
Glucose-Capillary: 215 mg/dL — ABNORMAL HIGH (ref 70–99)

## 2020-09-02 MED ORDER — ENOXAPARIN SODIUM 40 MG/0.4ML ~~LOC~~ SOLN
40.0000 mg | SUBCUTANEOUS | Status: DC
  Administered 2020-09-02 – 2020-09-06 (×5): 40 mg via SUBCUTANEOUS
  Filled 2020-09-02 (×5): qty 0.4

## 2020-09-02 NOTE — Progress Notes (Signed)
PROGRESS NOTE    Gregory Johnston  XAJ:[id number] DOB: 1966/05/07 DOA: 08/28/2020 PCP: Barbette Reichmann, MD   Brief Narrative: Gregory Johnston a 54 y.o.malewith medical history significant for type 2 diabetes mellitus and obstructive sleep apnea. He is not up-to-date with his Covid vaccination and presented to his primary care physician on 08/25/2020 with complaints of symptoms of generalized malaise, cough and with shortness of breath after contact with wife tested positive for Covid. Patient also tested positive on the daybut was deemed stable for conservative management at home.Today, patient presents due to worsening shortness of breath with minimalexertion.Symptoms have been associated with nonproductive cough,fever and chills. Patient was noted to be hypoxic on presentation and required 6 L/min of oxygen at triage.   As of 09/01/2020 patient remains dependent on heated high flow nasal cannula 60 L.  He has normal work of breathing.  He is mentating clearly.  Assessment & Plan:   Active Problems:   Acute respiratory disease due to COVID-19 virus  Multifocal pneumonia due to COVID-19 Acute hypoxic respiratory failure -Oxygen requirement on presentation is acutely worsened to 60 L -Elevated inflammatory markers and multifocal opacities consistent with known diagnosis -Normal work of breathing, intact mentation -Remains dependent on heated high flow nasal cannula PLAN: Continue remdesivir, day 4/5 Continue baricitinib, day 5/14 Continue Solu-Medrol 80 mg every 12 hours, day 5/10 High rate oxygen wean as tolerated Stressed I-S and flutter use Prone as tolerated No Lasix today  Diabetes mellitus type 2 poorly controlled Hyperglycemia exacerbated by steroid --A1c 8.0 PLAN: --Hold home oral agent --cont Levemir 15u BID --add mealtime 4u TID --SSI TID  History of obstructive sleep apnea: Noncompliant with CPAP at home. --Hold CPAP due to COVID    Hyperkalemia, resolved Etiology unclear. No EKG changes. Patient is asymptomatic.  --s/p Kayexalate 15g x1  --Monitor and treat PRN   DVT prophylaxis: Lovenox Code Status: Full code Family Communication: Wife Diquan Kassis via phone [phone] 09/01/2020 Disposition Plan: Status is: Inpatient  Remains inpatient appropriate because:Inpatient level of care appropriate due to severity of illness   Dispo: The patient is from: Home              Anticipated d/c is to: Home              Anticipated d/c date is: 3 days              Patient currently is not medically stable to d/c.  Patient remains dependent on heated high flow nasal cannula.  Normal work of breathing.  Mentation intact.  Unable to wean at this time.  We'll continue to monitor respiratory status carefully.   Consultants:   None  Procedures:  None Antimicrobials:  Remdesivir   Subjective: Patient seen and examined.  Remains on heated high flow.  No appreciable change in symptoms.  No pain complaints.  Patient overall feels well.  Objective: Vitals:   09/02/20 0312 09/02/20 0406 09/02/20 0609 09/02/20 0838  BP:  119/76  110/71  Pulse:  60  64  Resp:      Temp:  98.3 F (36.8 C)  98.4 F (36.9 C)  TempSrc:  Oral  Oral  SpO2: 98% 100%  99%  Weight:   88.3 kg   Height:        Intake/Output Summary (Last 24 hours) at 09/02/2020 1310 Last data filed at 09/02/2020 1201 Gross per 24 hour  Intake --  Output 1725 ml  Net -1725 ml   [employer]  08/31/20 0458 09/01/20 0614 09/02/20 0609  Weight: 91.6 kg 89.3 kg 88.3 kg    Examination:  General exam: Appears calm and comfortable  Respiratory system: Bilateral coarse crackles.  Normal work of breathing.  60 L Cardiovascular system: S1 & S2 heard, RRR. No JVD, murmurs, rubs, gallops or clicks. No pedal edema. Gastrointestinal system: Abdomen is nondistended, soft and nontender. No organomegaly or masses felt. Normal bowel sounds heard. Central  nervous system: Alert and oriented. No focal neurological deficits. Extremities: Symmetric 5 x 5 power. Skin: No rashes, lesions or ulcers Psychiatry: Judgement and insight appear normal. Mood & affect appropriate.     Data Reviewed: I have personally reviewed following labs and imaging studies  CBC: Recent Labs  Lab 08/28/20 1839 08/29/20 0451 08/30/20 0636 08/31/20 0531 09/01/20 0629  WBC 7.5 6.0 8.0 8.7 9.4  NEUTROABS  --  5.0  --   --   --   HGB 13.2 13.5 12.9* 13.4 14.1  HCT 37.3* 38.3* 36.5* 37.8* 39.9  MCV 84.0 85.1 84.5 83.3 84.0  PLT 198 230 251 313 280   Basic Metabolic Panel: Recent Labs  Lab 08/28/20 1523 08/28/20 1523 08/28/20 1839 08/29/20 0451 08/30/20 0636 08/31/20 0531 09/01/20 0629  NA 130*  --   --  132* 131* 133* 132*  K 5.5*   < > 5.4* 5.1 5.4* 4.7 4.9  CL 95*  --   --  97* 98 97* 96*  CO2 24  --   --  24 23 24 27   GLUCOSE 350*  --   --  338* 322* 271* 214*  BUN 22*  --   --  24* 27* 26* 24*  CREATININE 1.11  --   --  1.22 1.02 0.95 1.07  CALCIUM 7.9*  --   --  8.5* 8.4* 8.4* 8.2*  MG  --   --   --   --  2.6* 2.6* 2.3   < > = values in this interval not displayed.   GFR: Estimated Creatinine Clearance: 83.3 mL/min (by C-G formula based on SCr of 1.07 mg/dL). Liver Function Tests: Recent Labs  Lab 08/28/20 1523 08/29/20 0451  AST 37 37  ALT 19 22  ALKPHOS 52 57  BILITOT 0.6 0.8  PROT 7.0 7.4  ALBUMIN 3.1* 3.1*   No results for input(s): LIPASE, AMYLASE in the last 168 hours. No results for input(s): AMMONIA in the last 168 hours. Coagulation Profile: No results for input(s): INR, PROTIME in the last 168 hours. Cardiac Enzymes: No results for input(s): CKTOTAL, CKMB, CKMBINDEX, TROPONINI in the last 168 hours. BNP (last 3 results) No results for input(s): PROBNP in the last 8760 hours. HbA1C: No results for input(s): HGBA1C in the last 72 hours. CBG: Recent Labs  Lab 09/01/20 1157 09/01/20 1659 09/01/20 2227 09/02/20 0915  09/02/20 1157  GLUCAP 170* 160* 223* 251* 210*   Lipid Profile: No results for input(s): CHOL, HDL, LDLCALC, TRIG, CHOLHDL, LDLDIRECT in the last 72 hours. Thyroid Function Tests: No results for input(s): TSH, T4TOTAL, FREET4, T3FREE, THYROIDAB in the last 72 hours. Anemia Panel: No results for input(s): VITAMINB12, FOLATE, FERRITIN, TIBC, IRON, RETICCTPCT in the last 72 hours. Sepsis Labs: Recent Labs  Lab 08/29/20 0451  PROCALCITON 0.10    Recent Results (from the past 240 hour(s))  Culture, blood (single) w Reflex to ID Panel     Status: None   Collection Time: 08/28/20  6:40 PM   Specimen: BLOOD  Result Value Ref Range Status  Specimen Description BLOOD BLOOD LEFT FOREARM  Final   Special Requests   Final    BOTTLES DRAWN AEROBIC AND ANAEROBIC Blood Culture adequate volume   Culture   Final    NO GROWTH 5 DAYS Performed at [facility], [address] [place], [place] [address]    Report Status 09/02/2020 FINAL  Final  Culture, sputum-assessment     Status: None   Collection Time: 08/29/20  4:51 AM   Specimen: Sputum  Result Value Ref Range Status   Specimen Description SPUTUM  Final   Special Requests NONE  Final   Sputum evaluation   Final    Sputum specimen not acceptable for testing.  Please recollect.   C/JESSICA COLTRANE AT 8127 08/29/20.PMF Performed at [facility], [address] [place], [place] [address]    Report Status 08/29/2020 FINAL  Final         Radiology Studies: No results found.      Scheduled Meds: . baricitinib  4 mg Oral Daily  . enoxaparin (LOVENOX) injection  40 mg Subcutaneous Q24H  . folic acid  1 mg Oral Daily  . gabapentin  100 mg Oral BID  . insulin aspart  0-15 Units Subcutaneous TID WC  . insulin aspart  4 Units Subcutaneous TID WC  . insulin detemir  15 Units Subcutaneous BID  . Ipratropium-Albuterol  1 puff Inhalation QID  . meloxicam  15 mg Oral Daily  . methylPREDNISolone (SOLU-MEDROL)  injection  80 mg Intravenous Q12H  . multivitamin with minerals  1 tablet Oral Daily  . pantoprazole  40 mg Oral Daily   Continuous Infusions: . sodium chloride Stopped (08/30/20 1217)     LOS: 5 days    Time spent: 15 minutes    Tresa Moore, MD [facility] Pager [phone]  If 7PM-7AM, please contact night-coverage 09/02/2020, 1:10 PM

## 2020-09-02 NOTE — Plan of Care (Signed)
Patient able to maintain O2 sats this shift on 14 L HFNC improved today from heated HFNC. He has healthy appetite eating 80-100% of meals. No complaints or distress noted. Wife updated about patient progress with patient approval x2 this shift.  Problem: Education: Goal: Knowledge of General Education information will improve Description: Including pain rating scale, medication(s)/side effects and non-pharmacologic comfort measures Outcome: Progressing   Problem: Health Behavior/Discharge Planning: Goal: Ability to manage health-related needs will improve Outcome: Progressing   Problem: Clinical Measurements: Goal: Ability to maintain clinical measurements within normal limits will improve Outcome: Progressing Goal: Respiratory complications will improve Outcome: Progressing   Problem: Nutrition: Goal: Adequate nutrition will be maintained Outcome: Progressing

## 2020-09-03 LAB — CBC WITH DIFFERENTIAL/PLATELET
Abs Immature Granulocytes: 0.11 10*3/uL — ABNORMAL HIGH (ref 0.00–0.07)
Basophils Absolute: 0 10*3/uL (ref 0.0–0.1)
Basophils Relative: 0 %
Eosinophils Absolute: 0 10*3/uL (ref 0.0–0.5)
Eosinophils Relative: 0 %
HCT: 38.9 % — ABNORMAL LOW (ref 39.0–52.0)
Hemoglobin: 14.1 g/dL (ref 13.0–17.0)
Immature Granulocytes: 1 %
Lymphocytes Relative: 4 %
Lymphs Abs: 0.5 10*3/uL — ABNORMAL LOW (ref 0.7–4.0)
MCH: 30.6 pg (ref 26.0–34.0)
MCHC: 36.2 g/dL — ABNORMAL HIGH (ref 30.0–36.0)
MCV: 84.4 fL (ref 80.0–100.0)
Monocytes Absolute: 0.4 10*3/uL (ref 0.1–1.0)
Monocytes Relative: 3 %
Neutro Abs: 11.2 10*3/uL — ABNORMAL HIGH (ref 1.7–7.7)
Neutrophils Relative %: 92 %
Platelets: 199 10*3/uL (ref 150–400)
RBC: 4.61 MIL/uL (ref 4.22–5.81)
RDW: 11.5 % (ref 11.5–15.5)
WBC: 12.2 10*3/uL — ABNORMAL HIGH (ref 4.0–10.5)
nRBC: 0 % (ref 0.0–0.2)

## 2020-09-03 LAB — GLUCOSE, CAPILLARY
Glucose-Capillary: 166 mg/dL — ABNORMAL HIGH (ref 70–99)
Glucose-Capillary: 167 mg/dL — ABNORMAL HIGH (ref 70–99)
Glucose-Capillary: 287 mg/dL — ABNORMAL HIGH (ref 70–99)
Glucose-Capillary: 267 mg/dL — ABNORMAL HIGH (ref 70–99)

## 2020-09-03 LAB — BASIC METABOLIC PANEL
Anion gap: 11 (ref 5–15)
BUN: 25 mg/dL — ABNORMAL HIGH (ref 6–20)
CO2: 23 mmol/L (ref 22–32)
Calcium: 8.5 mg/dL — ABNORMAL LOW (ref 8.9–10.3)
Chloride: 100 mmol/L (ref 98–111)
Creatinine, Ser: 0.87 mg/dL (ref 0.61–1.24)
GFR, Estimated: >60 mL/min (ref >60)
Glucose, Bld: 187 mg/dL — ABNORMAL HIGH (ref 70–99)
Potassium: 4.9 mmol/L (ref 3.5–5.1)
Sodium: 134 mmol/L — ABNORMAL LOW (ref 135–145)

## 2020-09-03 LAB — C-REACTIVE PROTEIN: CRP: 3.4 mg/dL — ABNORMAL HIGH (ref <1.0)

## 2020-09-03 MED ORDER — METHYLPREDNISOLONE SODIUM SUCC 125 MG IJ SOLR
60.0000 mg | Freq: Two times a day (BID) | INTRAMUSCULAR | Status: DC
  Administered 2020-09-03 – 2020-09-05 (×5): 60 mg via INTRAVENOUS
  Filled 2020-09-03 (×6): qty 2

## 2020-09-03 NOTE — Progress Notes (Signed)
PROGRESS NOTE    Gregory Johnston  ZDG:[id number] DOB: [birth year] DOA: 08/28/2020 PCP: Barbette Reichmann, MD   Brief Narrative: Gregory Johnston a 54 y.o.malewith medical history significant for type 2 diabetes mellitus and obstructive sleep apnea. He is not up-to-date with his Covid vaccination and presented to his primary care physician on 08/25/2020 with complaints of symptoms of generalized malaise, cough and with shortness of breath after contact with wife tested positive for Covid. Patient also tested positive on the daybut was deemed stable for conservative management at home.Today, patient presents due to worsening shortness of breath with minimalexertion.Symptoms have been associated with nonproductive cough,fever and chills. Patient was noted to be hypoxic on presentation and required 6 L/min of oxygen at triage.   As of 09/01/2020 patient remains dependent on heated high flow nasal cannula 60 L.  He has normal work of breathing.  He is mentating clearly.  12/3: Weaned off heated high flow nasal cannula.  Now on 15 L high flow saturating well.  Healthy appetite.  No respiratory distress  Assessment & Plan:   Active Problems:   Acute respiratory disease due to COVID-19 virus  Multifocal pneumonia due to COVID-19 Acute hypoxic respiratory failure -Oxygen requirement on presentation is acutely worsened to 60 L -Elevated inflammatory markers and multifocal opacities consistent with known diagnosis -Normal work of breathing, intact mentation -Weaned off heated high flow, now on 15 L high flow PLAN: Continue remdesivir, day 5/5 Continue baricitinib, day 6/14 Continue Solu-Medrol 60 mg every 12 hours, day 6/10 Supplemental oxygen, wean as tolerated Stressed I-S and flutter use Prone as tolerated No Lasix today.  Patient been net negative over interval  Diabetes mellitus type 2 poorly controlled Hyperglycemia exacerbated by steroid --A1c 8.0 PLAN: --Hold home  oral agent --cont Levemir 15u BID --add mealtime 4u TID --SSI TID  History of obstructive sleep apnea: Noncompliant with CPAP at home. --Hold CPAP due to COVID   Hyperkalemia, resolved Etiology unclear. No EKG changes. Patient is asymptomatic.  --s/p Kayexalate 15g x1  --Monitor and treat PRN   DVT prophylaxis: Lovenox Code Status: Full code Family Communication: Wife Gregory Johnston via phone [phone] 09/03/2020 Disposition Plan: Status is: Inpatient  Remains inpatient appropriate because:Inpatient level of care appropriate due to severity of illness   Dispo: The patient is from: Home              Anticipated d/c is to: Home              Anticipated d/c date is: 2 days              Patient currently is not medically stable to d/c.   Still with acute hypoxic respiratory failure, improving over interval.  Not medically stable for discharge at this time.  Will attempt to wean entirely from supplemental oxygen.   Consultants:   None  Procedures:  None Antimicrobials:     Subjective: Patient seen and examined.  Energy level looks improved.  On high flow nasal cannula 15 L.  Objective: Vitals:   09/03/20 0324 09/03/20 0917 09/03/20 1034 09/03/20 1212  BP: 120/67 111/72  108/67  Pulse: (!) 58 65  75  Resp: 18 19  18   Temp: 97.9 F (36.6 C) 98.6 F (37 C)  98.4 F (36.9 C)  TempSrc: Oral Oral  Oral  SpO2: 92% 90% 90% 90%  Weight: 86.8 kg     Height:        Intake/Output Summary (Last 24 hours) at 09/03/2020 1254  Last data filed at 09/03/2020 1213 Gross per 24 hour  Intake --  Output 1700 ml  Net -1700 ml   Filed Weights   09/01/20 0614 09/02/20 0609 09/03/20 0324  Weight: 89.3 kg 88.3 kg 86.8 kg    Examination:  General exam: Appears calm and comfortable  Respiratory system: Bibasilar crackles.  Normal work of breathing.  15 L Cardiovascular system: S1 & S2 heard, RRR. No JVD, murmurs, rubs, gallops or clicks. No pedal edema. Gastrointestinal  system: Abdomen is nondistended, soft and nontender. No organomegaly or masses felt. Normal bowel sounds heard. Central nervous system: Alert and oriented. No focal neurological deficits. Extremities: Symmetric 5 x 5 power. Skin: No rashes, lesions or ulcers Psychiatry: Judgement and insight appear normal. Mood & affect appropriate.     Data Reviewed: I have personally reviewed following labs and imaging studies  CBC: Recent Labs  Lab 08/29/20 0451 08/30/20 0636 08/31/20 0531 09/01/20 0629 09/03/20 0643  WBC 6.0 8.0 8.7 9.4 12.2*  NEUTROABS 5.0  --   --   --  11.2*  HGB 13.5 12.9* 13.4 14.1 14.1  HCT 38.3* 36.5* 37.8* 39.9 38.9*  MCV 85.1 84.5 83.3 84.0 84.4  PLT 230 251 313 280 199   Basic Metabolic Panel: Recent Labs  Lab 08/29/20 0451 08/30/20 0636 08/31/20 0531 09/01/20 0629 09/03/20 0643  NA 132* 131* 133* 132* 134*  K 5.1 5.4* 4.7 4.9 4.9  CL 97* 98 97* 96* 100  CO2 24 23 24 27 23   GLUCOSE 338* 322* 271* 214* 187*  BUN 24* 27* 26* 24* 25*  CREATININE 1.22 1.02 0.95 1.07 0.87  CALCIUM 8.5* 8.4* 8.4* 8.2* 8.5*  MG  --  2.6* 2.6* 2.3  --    GFR: Estimated Creatinine Clearance: 101.6 mL/min (by C-G formula based on SCr of 0.87 mg/dL). Liver Function Tests: Recent Labs  Lab 08/28/20 1523 08/29/20 0451  AST 37 37  ALT 19 22  ALKPHOS 52 57  BILITOT 0.6 0.8  PROT 7.0 7.4  ALBUMIN 3.1* 3.1*   No results for input(s): LIPASE, AMYLASE in the last 168 hours. No results for input(s): AMMONIA in the last 168 hours. Coagulation Profile: No results for input(s): INR, PROTIME in the last 168 hours. Cardiac Enzymes: No results for input(s): CKTOTAL, CKMB, CKMBINDEX, TROPONINI in the last 168 hours. BNP (last 3 results) No results for input(s): PROBNP in the last 8760 hours. HbA1C: No results for input(s): HGBA1C in the last 72 hours. CBG: Recent Labs  Lab 09/02/20 1157 09/02/20 1712 09/02/20 2030 09/03/20 0916 09/03/20 1211  GLUCAP 210* 154* 215* 166*  167*   Lipid Profile: No results for input(s): CHOL, HDL, LDLCALC, TRIG, CHOLHDL, LDLDIRECT in the last 72 hours. Thyroid Function Tests: No results for input(s): TSH, T4TOTAL, FREET4, T3FREE, THYROIDAB in the last 72 hours. Anemia Panel: No results for input(s): VITAMINB12, FOLATE, FERRITIN, TIBC, IRON, RETICCTPCT in the last 72 hours. Sepsis Labs: Recent Labs  Lab 08/29/20 0451  PROCALCITON 0.10    Recent Results (from the past 240 hour(s))  Culture, blood (single) w Reflex to ID Panel     Status: None   Collection Time: 08/28/20  6:40 PM   Specimen: BLOOD  Result Value Ref Range Status   Specimen Description BLOOD BLOOD LEFT FOREARM  Final   Special Requests   Final    BOTTLES DRAWN AEROBIC AND ANAEROBIC Blood Culture adequate volume   Culture   Final    NO GROWTH 5 DAYS Performed at [facility], [address] [place], [place] [address]    Report Status 09/02/2020 FINAL  Final  Culture, sputum-assessment     Status: None   Collection Time: 08/29/20  4:51 AM   Specimen: Sputum  Result Value Ref Range Status   Specimen Description SPUTUM  Final   Special Requests NONE  Final   Sputum evaluation   Final    Sputum specimen not acceptable for testing.  Please recollect.   C/JESSICA COLTRANE AT 4098 08/29/20.PMF Performed at [facility], [address] [place], [place] [address]    Report Status 08/29/2020 FINAL  Final         Radiology Studies: No results found.      Scheduled Meds: . baricitinib  4 mg Oral Daily  . enoxaparin (LOVENOX) injection  40 mg Subcutaneous Q24H  . folic acid  1 mg Oral Daily  . gabapentin  100 mg Oral BID  . insulin aspart  0-15 Units Subcutaneous TID WC  . insulin aspart  4 Units Subcutaneous TID WC  . insulin detemir  15 Units Subcutaneous BID  . Ipratropium-Albuterol  1 puff Inhalation QID  . meloxicam  15 mg Oral Daily  . methylPREDNISolone (SOLU-MEDROL) injection  60 mg Intravenous Q12H  .  multivitamin with minerals  1 tablet Oral Daily  . pantoprazole  40 mg Oral Daily   Continuous Infusions: . sodium chloride Stopped (08/30/20 1217)     LOS: 6 days    Time spent: 15 minutes    Tresa Moore, MD [facility] Pager [phone]  If 7PM-7AM, please contact night-coverage 09/03/2020, 12:54 PM

## 2020-09-04 LAB — BASIC METABOLIC PANEL
Anion gap: 9 (ref 5–15)
BUN: 28 mg/dL — ABNORMAL HIGH (ref 6–20)
CO2: 24 mmol/L (ref 22–32)
Calcium: 8.4 mg/dL — ABNORMAL LOW (ref 8.9–10.3)
Chloride: 96 mmol/L — ABNORMAL LOW (ref 98–111)
Creatinine, Ser: 1.03 mg/dL (ref 0.61–1.24)
GFR, Estimated: >60 mL/min (ref >60)
Glucose, Bld: 303 mg/dL — ABNORMAL HIGH (ref 70–99)
Potassium: 5.1 mmol/L (ref 3.5–5.1)
Sodium: 129 mmol/L — ABNORMAL LOW (ref 135–145)

## 2020-09-04 LAB — CBC WITH DIFFERENTIAL/PLATELET
Abs Immature Granulocytes: 0.08 10*3/uL — ABNORMAL HIGH (ref 0.00–0.07)
Basophils Absolute: 0 10*3/uL (ref 0.0–0.1)
Basophils Relative: 0 %
Eosinophils Absolute: 0 10*3/uL (ref 0.0–0.5)
Eosinophils Relative: 0 %
HCT: 36.8 % — ABNORMAL LOW (ref 39.0–52.0)
Hemoglobin: 13 g/dL (ref 13.0–17.0)
Immature Granulocytes: 1 %
Lymphocytes Relative: 3 %
Lymphs Abs: 0.3 10*3/uL — ABNORMAL LOW (ref 0.7–4.0)
MCH: 29.5 pg (ref 26.0–34.0)
MCHC: 35.3 g/dL (ref 30.0–36.0)
MCV: 83.4 fL (ref 80.0–100.0)
Monocytes Absolute: 0.4 10*3/uL (ref 0.1–1.0)
Monocytes Relative: 3 %
Neutro Abs: 12.2 10*3/uL — ABNORMAL HIGH (ref 1.7–7.7)
Neutrophils Relative %: 93 %
Platelets: 178 10*3/uL (ref 150–400)
RBC: 4.41 MIL/uL (ref 4.22–5.81)
RDW: 11.3 % — ABNORMAL LOW (ref 11.5–15.5)
WBC: 13 10*3/uL — ABNORMAL HIGH (ref 4.0–10.5)
nRBC: 0 % (ref 0.0–0.2)

## 2020-09-04 LAB — GLUCOSE, CAPILLARY
Glucose-Capillary: 240 mg/dL — ABNORMAL HIGH (ref 70–99)
Glucose-Capillary: 269 mg/dL — ABNORMAL HIGH (ref 70–99)
Glucose-Capillary: 226 mg/dL — ABNORMAL HIGH (ref 70–99)
Glucose-Capillary: 219 mg/dL — ABNORMAL HIGH (ref 70–99)

## 2020-09-04 LAB — C-REACTIVE PROTEIN: CRP: 2.4 mg/dL — ABNORMAL HIGH (ref <1.0)

## 2020-09-04 MED ORDER — INSULIN DETEMIR 100 UNIT/ML ~~LOC~~ SOLN
18.0000 [IU] | Freq: Two times a day (BID) | SUBCUTANEOUS | Status: DC
  Administered 2020-09-04 – 2020-09-06 (×5): 18 [IU] via SUBCUTANEOUS
  Filled 2020-09-04 (×6): qty 0.18

## 2020-09-04 MED ORDER — INSULIN ASPART 100 UNIT/ML ~~LOC~~ SOLN
5.0000 [IU] | Freq: Three times a day (TID) | SUBCUTANEOUS | Status: DC
  Administered 2020-09-04 – 2020-09-06 (×8): 5 [IU] via SUBCUTANEOUS
  Filled 2020-09-04 (×5): qty 1

## 2020-09-04 MED ORDER — ONDANSETRON HCL 4 MG/2ML IJ SOLN
4.0000 mg | Freq: Four times a day (QID) | INTRAMUSCULAR | Status: DC | PRN
  Filled 2020-09-04: qty 2

## 2020-09-04 NOTE — Progress Notes (Signed)
PROGRESS NOTE    Gregory Johnston  RCV:[id number] DOB: Feb 13, 1966 DOA: 08/28/2020 PCP: Barbette Reichmann, MD   Brief Narrative: Gregory Johnston a 54 y.o.malewith medical history significant for type 2 diabetes mellitus and obstructive sleep apnea. He is not up-to-date with his Covid vaccination and presented to his primary care physician on 08/25/2020 with complaints of symptoms of generalized malaise, cough and with shortness of breath after contact with wife tested positive for Covid. Patient also tested positive on the daybut was deemed stable for conservative management at home.Today, patient presents due to worsening shortness of breath with minimalexertion.Symptoms have been associated with nonproductive cough,fever and chills. Patient was noted to be hypoxic on presentation and required 6 L/min of oxygen at triage.   As of 09/01/2020 patient remains dependent on heated high flow nasal cannula 60 L.  He has normal work of breathing.  He is mentating clearly.  12/3: Weaned off heated high flow nasal cannula.  Now on 15 L high flow saturating well.  Healthy appetite.  No respiratory distress  12/4: Weaned to 12 L high flow nasal cannula  Assessment & Plan:   Active Problems:   Acute respiratory disease due to COVID-19 virus  Multifocal pneumonia due to COVID-19 Acute hypoxic respiratory failure -Oxygen requirement on presentation is acutely worsened to 60 L -Elevated inflammatory markers and multifocal opacities consistent with known diagnosis -Normal work of breathing, intact mentation -Weaned off heated high flow, now on 12 L high flow Completed course of remdesivir PLAN: Continue baricitinib, day 7/14 Continue Solu-Medrol 60 mg every 12 hours, day 7/10 Supplemental oxygen, wean as tolerated Stressed I-S and flutter use Prone as tolerated Hold diuretic today.  Patient net negative  Diabetes mellitus type 2 poorly controlled Hyperglycemia exacerbated by  steroid --A1c 8.0 PLAN: --Hold home oral agent --cont Levemir 15u BID --add mealtime 4u TID --SSI TID  History of obstructive sleep apnea: Noncompliant with CPAP at home. --Hold CPAP due to COVID   Hyperkalemia, resolved Etiology unclear. No EKG changes. Patient is asymptomatic.  --s/p Kayexalate 15g x1  --Monitor and treat PRN   DVT prophylaxis: Lovenox Code Status: Full code Family Communication: Wife Dillin Lofgren via phone [phone] 09/03/2020 Disposition Plan: Status is: Inpatient  Remains inpatient appropriate because:Inpatient level of care appropriate due to severity of illness   Dispo: The patient is from: Home              Anticipated d/c is to: Home              Anticipated d/c date is: 2 days              Patient currently is not medically stable to d/c.   Resolving acute hypoxic respiratory failure.  Wean to 12 L.  Not medically stable for discharge yet.  I will attempt to wean to 2 L or less prior to disposition.  Anticipate 48 hours   Consultants:   None  Procedures:  None Antimicrobials:     Subjective: Patient seen and examined.  Energy level improved.  Adherent to incentive spirometry and flutter valve use.  Weaned to 12 L  Objective: Vitals:   09/03/20 2027 09/04/20 0326 09/04/20 0357 09/04/20 0835  BP: 121/62 133/61 110/68 105/62  Pulse:  62 (!) 59 64  Resp: 20 16  16   Temp: (!) 97.5 F (36.4 C) 97.8 F (36.6 C) 98.2 F (36.8 C) 98.8 F (37.1 C)  TempSrc: Axillary Axillary Oral Oral  SpO2: 94% 98% 90% 90%  Weight:      Height:        Intake/Output Summary (Last 24 hours) at 09/04/2020 1314 Last data filed at 09/04/2020 0836 Gross per 24 hour  Intake --  Output 1375 ml  Net -1375 ml   Filed Weights   09/01/20 0614 09/02/20 0609 09/03/20 0324  Weight: 89.3 kg 88.3 kg 86.8 kg    Examination:  General exam: Appears calm and comfortable  Respiratory system: Bibasilar crackles.  Normal work of breathing.  12  L Cardiovascular system: S1 & S2 heard, RRR. No JVD, murmurs, rubs, gallops or clicks. No pedal edema. Gastrointestinal system: Abdomen is nondistended, soft and nontender. No organomegaly or masses felt. Normal bowel sounds heard. Central nervous system: Alert and oriented. No focal neurological deficits. Extremities: Symmetric 5 x 5 power. Skin: No rashes, lesions or ulcers Psychiatry: Judgement and insight appear normal. Mood & affect appropriate.     Data Reviewed: I have personally reviewed following labs and imaging studies  CBC: Recent Labs  Lab 08/29/20 0451 08/29/20 0451 08/30/20 0636 08/31/20 0531 09/01/20 0629 09/03/20 0643 09/04/20 0551  WBC 6.0   < > 8.0 8.7 9.4 12.2* 13.0*  NEUTROABS 5.0  --   --   --   --  11.2* 12.2*  HGB 13.5   < > 12.9* 13.4 14.1 14.1 13.0  HCT 38.3*   < > 36.5* 37.8* 39.9 38.9* 36.8*  MCV 85.1   < > 84.5 83.3 84.0 84.4 83.4  PLT 230   < > 251 313 280 199 178   < > = values in this interval not displayed.   Basic Metabolic Panel: Recent Labs  Lab 08/30/20 0636 08/31/20 0531 09/01/20 0629 09/03/20 0643 09/04/20 0551  NA 131* 133* 132* 134* 129*  K 5.4* 4.7 4.9 4.9 5.1  CL 98 97* 96* 100 96*  CO2 23 24 27 23 24   GLUCOSE 322* 271* 214* 187* 303*  BUN 27* 26* 24* 25* 28*  CREATININE 1.02 0.95 1.07 0.87 1.03  CALCIUM 8.4* 8.4* 8.2* 8.5* 8.4*  MG 2.6* 2.6* 2.3  --   --    GFR: Estimated Creatinine Clearance: 85.9 mL/min (by C-G formula based on SCr of 1.03 mg/dL). Liver Function Tests: Recent Labs  Lab 08/28/20 1523 08/29/20 0451  AST 37 37  ALT 19 22  ALKPHOS 52 57  BILITOT 0.6 0.8  PROT 7.0 7.4  ALBUMIN 3.1* 3.1*   No results for input(s): LIPASE, AMYLASE in the last 168 hours. No results for input(s): AMMONIA in the last 168 hours. Coagulation Profile: No results for input(s): INR, PROTIME in the last 168 hours. Cardiac Enzymes: No results for input(s): CKTOTAL, CKMB, CKMBINDEX, TROPONINI in the last 168 hours. BNP  (last 3 results) No results for input(s): PROBNP in the last 8760 hours. HbA1C: No results for input(s): HGBA1C in the last 72 hours. CBG: Recent Labs  Lab 09/03/20 0916 09/03/20 1211 09/03/20 1636 09/03/20 2115 09/04/20 0832  GLUCAP 166* 167* 287* 267* 240*   Lipid Profile: No results for input(s): CHOL, HDL, LDLCALC, TRIG, CHOLHDL, LDLDIRECT in the last 72 hours. Thyroid Function Tests: No results for input(s): TSH, T4TOTAL, FREET4, T3FREE, THYROIDAB in the last 72 hours. Anemia Panel: No results for input(s): VITAMINB12, FOLATE, FERRITIN, TIBC, IRON, RETICCTPCT in the last 72 hours. Sepsis Labs: Recent Labs  Lab 08/29/20 0451  PROCALCITON 0.10    Recent Results (from the past 240 hour(s))  Culture, blood (single) w Reflex to ID Panel  Status: None   Collection Time: 08/28/20  6:40 PM   Specimen: BLOOD  Result Value Ref Range Status   Specimen Description BLOOD BLOOD LEFT FOREARM  Final   Special Requests   Final    BOTTLES DRAWN AEROBIC AND ANAEROBIC Blood Culture adequate volume   Culture   Final    NO GROWTH 5 DAYS Performed at [facility], [address] [place], [place] [address]    Report Status 09/02/2020 FINAL  Final  Culture, sputum-assessment     Status: None   Collection Time: 08/29/20  4:51 AM   Specimen: Sputum  Result Value Ref Range Status   Specimen Description SPUTUM  Final   Special Requests NONE  Final   Sputum evaluation   Final    Sputum specimen not acceptable for testing.  Please recollect.   C/JESSICA COLTRANE AT 7902 08/29/20.PMF Performed at [facility], [address], [place], [place] [address]    Report Status 08/29/2020 FINAL  Final         Radiology Studies: No results found.      Scheduled Meds: . baricitinib  4 mg Oral Daily  . enoxaparin (LOVENOX) injection  40 mg Subcutaneous Q24H  . folic acid  1 mg Oral Daily  . gabapentin  100 mg Oral BID  . insulin aspart  0-15 Units Subcutaneous  TID WC  . insulin aspart  5 Units Subcutaneous TID WC  . insulin detemir  18 Units Subcutaneous BID  . Ipratropium-Albuterol  1 puff Inhalation QID  . meloxicam  15 mg Oral Daily  . methylPREDNISolone (SOLU-MEDROL) injection  60 mg Intravenous Q12H  . multivitamin with minerals  1 tablet Oral Daily  . pantoprazole  40 mg Oral Daily   Continuous Infusions: . sodium chloride Stopped (08/30/20 1217)     LOS: 7 days    Time spent: 15 minutes    Tresa Moore, MD [facility] Pager [phone]  If 7PM-7AM, please contact night-coverage 09/04/2020, 1:14 PM

## 2020-09-05 LAB — CBC WITH DIFFERENTIAL/PLATELET
Abs Immature Granulocytes: 0.08 10*3/uL — ABNORMAL HIGH (ref 0.00–0.07)
Basophils Absolute: 0 10*3/uL (ref 0.0–0.1)
Basophils Relative: 0 %
Eosinophils Absolute: 0 10*3/uL (ref 0.0–0.5)
Eosinophils Relative: 0 %
HCT: 37.8 % — ABNORMAL LOW (ref 39.0–52.0)
Hemoglobin: 13.4 g/dL (ref 13.0–17.0)
Immature Granulocytes: 1 %
Lymphocytes Relative: 3 %
Lymphs Abs: 0.3 10*3/uL — ABNORMAL LOW (ref 0.7–4.0)
MCH: 29.8 pg (ref 26.0–34.0)
MCHC: 35.4 g/dL (ref 30.0–36.0)
MCV: 84 fL (ref 80.0–100.0)
Monocytes Absolute: 0.3 10*3/uL (ref 0.1–1.0)
Monocytes Relative: 3 %
Neutro Abs: 11.4 10*3/uL — ABNORMAL HIGH (ref 1.7–7.7)
Neutrophils Relative %: 93 %
Platelets: 178 10*3/uL (ref 150–400)
RBC: 4.5 MIL/uL (ref 4.22–5.81)
RDW: 11.4 % — ABNORMAL LOW (ref 11.5–15.5)
WBC: 12.2 10*3/uL — ABNORMAL HIGH (ref 4.0–10.5)
nRBC: 0 % (ref 0.0–0.2)

## 2020-09-05 LAB — BASIC METABOLIC PANEL
Anion gap: 9 (ref 5–15)
BUN: 24 mg/dL — ABNORMAL HIGH (ref 6–20)
CO2: 26 mmol/L (ref 22–32)
Calcium: 8.5 mg/dL — ABNORMAL LOW (ref 8.9–10.3)
Chloride: 97 mmol/L — ABNORMAL LOW (ref 98–111)
Creatinine, Ser: 0.98 mg/dL (ref 0.61–1.24)
GFR, Estimated: >60 mL/min (ref >60)
Glucose, Bld: 259 mg/dL — ABNORMAL HIGH (ref 70–99)
Potassium: 5.1 mmol/L (ref 3.5–5.1)
Sodium: 132 mmol/L — ABNORMAL LOW (ref 135–145)

## 2020-09-05 LAB — GLUCOSE, CAPILLARY
Glucose-Capillary: 220 mg/dL — ABNORMAL HIGH (ref 70–99)
Glucose-Capillary: 296 mg/dL — ABNORMAL HIGH (ref 70–99)
Glucose-Capillary: 265 mg/dL — ABNORMAL HIGH (ref 70–99)
Glucose-Capillary: 232 mg/dL — ABNORMAL HIGH (ref 70–99)

## 2020-09-05 LAB — C-REACTIVE PROTEIN: CRP: 2.8 mg/dL — ABNORMAL HIGH (ref <1.0)

## 2020-09-05 MED ORDER — FLUTICASONE PROPIONATE 50 MCG/ACT NA SUSP
2.0000 | Freq: Every day | NASAL | Status: DC
  Administered 2020-09-05 – 2020-09-07 (×3): 2 via NASAL
  Filled 2020-09-05: qty 16

## 2020-09-05 MED ORDER — BENZONATATE 100 MG PO CAPS
200.0000 mg | ORAL_CAPSULE | Freq: Three times a day (TID) | ORAL | Status: DC
  Administered 2020-09-05 – 2020-09-07 (×7): 200 mg via ORAL
  Filled 2020-09-05 (×7): qty 2

## 2020-09-05 MED ORDER — METHYLPREDNISOLONE SODIUM SUCC 40 MG IJ SOLR
40.0000 mg | Freq: Two times a day (BID) | INTRAMUSCULAR | Status: DC
  Administered 2020-09-05 – 2020-09-07 (×4): 40 mg via INTRAVENOUS
  Filled 2020-09-05 (×4): qty 1

## 2020-09-05 NOTE — Progress Notes (Signed)
PROGRESS NOTE    Gregory Johnston  TKZ:[id number] DOB: July 20, 1966 DOA: 08/28/2020 PCP: Barbette Reichmann, MD   Brief Narrative: Gregory Johnston a 54 y.o.malewith medical history significant for type 2 diabetes mellitus and obstructive sleep apnea. He is not up-to-date with his Covid vaccination and presented to his primary care physician on 08/25/2020 with complaints of symptoms of generalized malaise, cough and with shortness of breath after contact with wife tested positive for Covid. Patient also tested positive on the daybut was deemed stable for conservative management at home.Today, patient presents due to worsening shortness of breath with minimalexertion.Symptoms have been associated with nonproductive cough,fever and chills. Patient was noted to be hypoxic on presentation and required 6 L/min of oxygen at triage.   As of 09/01/2020 patient remains dependent on heated high flow nasal cannula 60 L.  He has normal work of breathing.  He is mentating clearly. 12/3: Weaned off heated high flow nasal cannula.  Now on 15 L high flow saturating well.  Healthy appetite.  No respiratory distress 12/4: Weaned to 12 L high flow nasal cannula 12/5: Continues to improve.  Down to 7-8 L high flow nasal cannula.  Mentating clearly.  Normal work of breathing.  Assessment & Plan:   Active Problems:   Acute respiratory disease due to COVID-19 virus  Multifocal pneumonia due to COVID-19 Acute hypoxic respiratory failure -Oxygen requirement on presentation is acutely worsened to 60 L -Elevated inflammatory markers and multifocal opacities consistent with known diagnosis -Normal work of breathing, intact mentation -Weaned off heated high flow, now on 8 L high flow Completed course of remdesivir PLAN: Continue baricitinib, day 8/14 Continue Solu-Medrol decrease dose to 40 mg every 12 hours, day 8/10 Supplemental oxygen, wean as tolerated Stressed I-S and flutter use Prone as  tolerated Hold diuretic today.  Patient markedly net negative since course of admission  Diabetes mellitus type 2 poorly controlled Hyperglycemia exacerbated by steroid --A1c 8.0 PLAN: --Hold home oral agent --cont Levemir 15u BID --add mealtime 4u TID --SSI TID  History of obstructive sleep apnea: Noncompliant with CPAP at home. --Hold CPAP due to COVID   Hyperkalemia, resolved Etiology unclear. No EKG changes. Patient is asymptomatic.  --s/p Kayexalate 15g x1  --Monitor and treat PRN   DVT prophylaxis: Lovenox Code Status: Full code Family Communication: Wife Jule Schlabach via phone [phone] 09/05/2020 Disposition Plan: Status is: Inpatient  Remains inpatient appropriate because:Inpatient level of care appropriate due to severity of illness   Dispo: The patient is from: Home              Anticipated d/c is to: Home              Anticipated d/c date is: 2 days              Patient currently is not medically stable to d/c.   Acute hypoxic respiratory failure, weaned to 8 L nasal cannula.  Not medically stable for discharge at this time but hoping we can get to 2 L or less within 48 hours.   Consultants:   None  Procedures:  None Antimicrobials:     Subjective: Patient seen and examined.  No specific complaints.  Energy level improved.  Cough improved.  Weaned to 8 L nasal cannula.  Objective: Vitals:   09/04/20 1937 09/05/20 0537 09/05/20 0815 09/05/20 1224  BP: 114/66  104/61 131/68  Pulse: 81  68 67  Resp: 18  19 18   Temp: 97.6 F (36.4 C)  98.4  F (36.9 C) 98.2 F (36.8 C)  TempSrc: Oral  Oral Oral  SpO2: 92%  99% 93%  Weight:  86.5 kg    Height:        Intake/Output Summary (Last 24 hours) at 09/05/2020 1233 Last data filed at 09/05/2020 0818 Gross per 24 hour  Intake --  Output 2300 ml  Net -2300 ml   Filed Weights   09/02/20 0609 09/03/20 0324 09/05/20 0537  Weight: 88.3 kg 86.8 kg 86.5 kg    Examination:  General exam:  Appears calm and comfortable  Respiratory system: Bibasilar crackles.  Normal work of breathing.  12 L Cardiovascular system: S1 & S2 heard, RRR. No JVD, murmurs, rubs, gallops or clicks. No pedal edema. Gastrointestinal system: Abdomen is nondistended, soft and nontender. No organomegaly or masses felt. Normal bowel sounds heard. Central nervous system: Alert and oriented. No focal neurological deficits. Extremities: Symmetric 5 x 5 power. Skin: No rashes, lesions or ulcers Psychiatry: Judgement and insight appear normal. Mood & affect appropriate.     Data Reviewed: I have personally reviewed following labs and imaging studies  CBC: Recent Labs  Lab 08/31/20 0531 09/01/20 0629 09/03/20 0643 09/04/20 0551 09/05/20 0534  WBC 8.7 9.4 12.2* 13.0* 12.2*  NEUTROABS  --   --  11.2* 12.2* 11.4*  HGB 13.4 14.1 14.1 13.0 13.4  HCT 37.8* 39.9 38.9* 36.8* 37.8*  MCV 83.3 84.0 84.4 83.4 84.0  PLT 313 280 199 178 178   Basic Metabolic Panel: Recent Labs  Lab 08/30/20 0636 08/30/20 0636 08/31/20 0531 09/01/20 0629 09/03/20 0643 09/04/20 0551 09/05/20 0534  NA 131*   < > 133* 132* 134* 129* 132*  K 5.4*   < > 4.7 4.9 4.9 5.1 5.1  CL 98   < > 97* 96* 100 96* 97*  CO2 23   < > 24 27 23 24 26   GLUCOSE 322*   < > 271* 214* 187* 303* 259*  BUN 27*   < > 26* 24* 25* 28* 24*  CREATININE 1.02   < > 0.95 1.07 0.87 1.03 0.98  CALCIUM 8.4*   < > 8.4* 8.2* 8.5* 8.4* 8.5*  MG 2.6*  --  2.6* 2.3  --   --   --    < > = values in this interval not displayed.   GFR: Estimated Creatinine Clearance: 90 mL/min (by C-G formula based on SCr of 0.98 mg/dL). Liver Function Tests: No results for input(s): AST, ALT, ALKPHOS, BILITOT, PROT, ALBUMIN in the last 168 hours. No results for input(s): LIPASE, AMYLASE in the last 168 hours. No results for input(s): AMMONIA in the last 168 hours. Coagulation Profile: No results for input(s): INR, PROTIME in the last 168 hours. Cardiac Enzymes: No results  for input(s): CKTOTAL, CKMB, CKMBINDEX, TROPONINI in the last 168 hours. BNP (last 3 results) No results for input(s): PROBNP in the last 8760 hours. HbA1C: No results for input(s): HGBA1C in the last 72 hours. CBG: Recent Labs  Lab 09/04/20 1328 09/04/20 1702 09/04/20 2130 09/05/20 0817 09/05/20 1226  GLUCAP 269* 226* 219* 220* 296*   Lipid Profile: No results for input(s): CHOL, HDL, LDLCALC, TRIG, CHOLHDL, LDLDIRECT in the last 72 hours. Thyroid Function Tests: No results for input(s): TSH, T4TOTAL, FREET4, T3FREE, THYROIDAB in the last 72 hours. Anemia Panel: No results for input(s): VITAMINB12, FOLATE, FERRITIN, TIBC, IRON, RETICCTPCT in the last 72 hours. Sepsis Labs: No results for input(s): PROCALCITON, LATICACIDVEN in the last 168 hours.  Recent Results (  from the past 240 hour(s))  Culture, blood (single) w Reflex to ID Panel     Status: None   Collection Time: 08/28/20  6:40 PM   Specimen: BLOOD  Result Value Ref Range Status   Specimen Description BLOOD BLOOD LEFT FOREARM  Final   Special Requests   Final    BOTTLES DRAWN AEROBIC AND ANAEROBIC Blood Culture adequate volume   Culture   Final    NO GROWTH 5 DAYS Performed at [facility], [address] [place], [place] [address]    Report Status 09/02/2020 FINAL  Final  Culture, sputum-assessment     Status: None   Collection Time: 08/29/20  4:51 AM   Specimen: Sputum  Result Value Ref Range Status   Specimen Description SPUTUM  Final   Special Requests NONE  Final   Sputum evaluation   Final    Sputum specimen not acceptable for testing.  Please recollect.   C/JESSICA COLTRANE AT 3662 08/29/20.PMF Performed at [facility], [address] [place], [place] [address]    Report Status 08/29/2020 FINAL  Final         Radiology Studies: No results found.      Scheduled Meds: . baricitinib  4 mg Oral Daily  . benzonatate  200 mg Oral Q8H  . enoxaparin (LOVENOX) injection  40  mg Subcutaneous Q24H  . fluticasone  2 spray Each Nare Daily  . folic acid  1 mg Oral Daily  . gabapentin  100 mg Oral BID  . insulin aspart  0-15 Units Subcutaneous TID WC  . insulin aspart  5 Units Subcutaneous TID WC  . insulin detemir  18 Units Subcutaneous BID  . Ipratropium-Albuterol  1 puff Inhalation QID  . meloxicam  15 mg Oral Daily  . methylPREDNISolone (SOLU-MEDROL) injection  60 mg Intravenous Q12H  . multivitamin with minerals  1 tablet Oral Daily  . pantoprazole  40 mg Oral Daily   Continuous Infusions: . sodium chloride Stopped (08/30/20 1217)     LOS: 8 days    Time spent: 15 minutes    Tresa Moore, MD [facility] Pager [phone]  If 7PM-7AM, please contact night-coverage 09/05/2020, 12:33 PM

## 2020-09-05 NOTE — Progress Notes (Signed)
CCMD called-  Patient desatted to 65%.   Checked patient-  Patient had a coughing spell.   After about 1 -2 minutes of deep breathing - O2 back up to 88-89%.    Patient states he feels congested.  Paged MD to let him know and get order for nasal spray.

## 2020-09-06 LAB — BASIC METABOLIC PANEL
Anion gap: 7 (ref 5–15)
BUN: 24 mg/dL — ABNORMAL HIGH (ref 6–20)
CO2: 27 mmol/L (ref 22–32)
Calcium: 8.5 mg/dL — ABNORMAL LOW (ref 8.9–10.3)
Chloride: 100 mmol/L (ref 98–111)
Creatinine, Ser: 0.98 mg/dL (ref 0.61–1.24)
GFR, Estimated: >60 mL/min (ref >60)
Glucose, Bld: 255 mg/dL — ABNORMAL HIGH (ref 70–99)
Potassium: 5.5 mmol/L — ABNORMAL HIGH (ref 3.5–5.1)
Sodium: 134 mmol/L — ABNORMAL LOW (ref 135–145)

## 2020-09-06 LAB — GLUCOSE, CAPILLARY
Glucose-Capillary: 331 mg/dL — ABNORMAL HIGH (ref 70–99)
Glucose-Capillary: 266 mg/dL — ABNORMAL HIGH (ref 70–99)
Glucose-Capillary: 359 mg/dL — ABNORMAL HIGH (ref 70–99)
Glucose-Capillary: 310 mg/dL — ABNORMAL HIGH (ref 70–99)

## 2020-09-06 LAB — C-REACTIVE PROTEIN: CRP: 2.6 mg/dL — ABNORMAL HIGH (ref <1.0)

## 2020-09-06 MED ORDER — INSULIN ASPART 100 UNIT/ML ~~LOC~~ SOLN: 0.0000 [IU] | Freq: Three times a day (TID) | SUBCUTANEOUS | Status: DC

## 2020-09-06 MED ORDER — INSULIN ASPART 100 UNIT/ML ~~LOC~~ SOLN
0.0000 [IU] | Freq: Every day | SUBCUTANEOUS | Status: DC
  Administered 2020-09-06: 4 [IU] via SUBCUTANEOUS
  Filled 2020-09-06: qty 1

## 2020-09-06 MED ORDER — INSULIN DETEMIR 100 UNIT/ML ~~LOC~~ SOLN
22.0000 [IU] | Freq: Two times a day (BID) | SUBCUTANEOUS | Status: DC
  Administered 2020-09-06 – 2020-09-07 (×2): 22 [IU] via SUBCUTANEOUS
  Filled 2020-09-06 (×4): qty 0.22

## 2020-09-06 NOTE — Progress Notes (Signed)
PROGRESS NOTE    Gregory Johnston  LKG:[id number] DOB: 07/19/1966 DOA: 08/28/2020 PCP: Barbette Reichmann, MD   Brief Narrative: Gregory Johnston a 54 y.o.malewith medical history significant for type 2 diabetes mellitus and obstructive sleep apnea. He is not up-to-date with his Covid vaccination and presented to his primary care physician on 08/25/2020 with complaints of symptoms of generalized malaise, cough and with shortness of breath after contact with wife tested positive for Covid. Patient also tested positive on the daybut was deemed stable for conservative management at home.Today, patient presents due to worsening shortness of breath with minimalexertion.Symptoms have been associated with nonproductive cough,fever and chills. Patient was noted to be hypoxic on presentation and required 6 L/min of oxygen at triage.   As of 09/01/2020 patient remains dependent on heated high flow nasal cannula 60 L.  He has normal work of breathing.  He is mentating clearly. 12/3: Weaned off heated high flow nasal cannula.  Now on 15 L high flow saturating well.  Healthy appetite.  No respiratory distress 12/4: Weaned to 12 L high flow nasal cannula 12/5: Continues to improve.  Down to 7-8 L high flow nasal cannula.  Mentating clearly.  Normal work of breathing. 12/6: Respiratory status improving.  Weaned to 6 L high flow nasal cannula.  Mentating clearly.  Normal work of breathing  Assessment & Plan:   Active Problems:   Acute respiratory disease due to COVID-19 virus  Multifocal pneumonia due to COVID-19 Acute hypoxic respiratory failure -Oxygen requirement on presentation is acutely worsened to 60 L -Elevated inflammatory markers and multifocal opacities consistent with known diagnosis -Normal work of breathing, intact mentation -Weaned off heated high flow, now on 8 L high flow Completed course of remdesivir PLAN: Continue baricitinib, day 9/14 Continue Solu-Medrol decrease dose  to 40 mg every 12 hours, day 9/10 Supplemental oxygen, wean as tolerated Stressed I-S and flutter use Prone as tolerated Hold diuretic today.  Patient markedly net negative since course of admission  Diabetes mellitus type 2 poorly controlled Hyperglycemia exacerbated by steroid --A1c 8.0 PLAN: --Hold home oral agent Levemir 22 units twice daily Moderate sliding scale Nightly scale   History of obstructive sleep apnea: Noncompliant with CPAP at home. --Hold CPAP due to COVID   Hyperkalemia, resolved Etiology unclear. No EKG changes. Patient is asymptomatic.  --s/p Kayexalate 15g x1  --Monitor and treat PRN   DVT prophylaxis: Lovenox Code Status: Full code Family Communication: Wife Angus Amini via phone [phone] 09/05/2020 Disposition Plan: Status is: Inpatient  Remains inpatient appropriate because:Inpatient level of care appropriate due to severity of illness   Dispo: The patient is from: Home              Anticipated d/c is to: Home              Anticipated d/c date is: 1 day              Patient currently is not medically stable to d/c.   Respiratory status improved.  Not medically stable for discharge yet.  10 used to be dependent on 6 L nasal cannula.  Will attempt to wean to 2 L or less.  For successful in doing so anticipate discharge home with home oxygen 09/07/2020.   Consultants:   None  Procedures:  None Antimicrobials:     Subjective: Patient seen and examined.  No specific complaints.  Energy level improved.  Cough improved.  Weaned to 6 L nasal cannula  Objective: Vitals:  09/05/20 1634 09/05/20 2018 09/06/20 0317 09/06/20 0827  BP: 125/66 117/70 130/69 120/68  Pulse: 62 70 (!) 57 (!) 45  Resp: 18   19  Temp: 98 F (36.7 C)  97.8 F (36.6 C) 97.8 F (36.6 C)  TempSrc: Oral  Oral Oral  SpO2: 90% 96% 97% 97%  Weight:   86.4 kg   Height:        Intake/Output Summary (Last 24 hours) at 09/06/2020 1208 Last data filed at  09/06/2020 0641 Gross per 24 hour  Intake --  Output 2845 ml  Net -2845 ml   Filed Weights   09/03/20 0324 09/05/20 0537 09/06/20 0317  Weight: 86.8 kg 86.5 kg 86.4 kg    Examination:  General exam: Appears calm and comfortable  Respiratory system: Bibasilar crackles.  Normal work of breathing.  12 L Cardiovascular system: S1 & S2 heard, RRR. No JVD, murmurs, rubs, gallops or clicks. No pedal edema. Gastrointestinal system: Abdomen is nondistended, soft and nontender. No organomegaly or masses felt. Normal bowel sounds heard. Central nervous system: Alert and oriented. No focal neurological deficits. Extremities: Symmetric 5 x 5 power. Skin: No rashes, lesions or ulcers Psychiatry: Judgement and insight appear normal. Mood & affect appropriate.     Data Reviewed: I have personally reviewed following labs and imaging studies  CBC: Recent Labs  Lab 08/31/20 0531 09/01/20 0629 09/03/20 0643 09/04/20 0551 09/05/20 0534  WBC 8.7 9.4 12.2* 13.0* 12.2*  NEUTROABS  --   --  11.2* 12.2* 11.4*  HGB 13.4 14.1 14.1 13.0 13.4  HCT 37.8* 39.9 38.9* 36.8* 37.8*  MCV 83.3 84.0 84.4 83.4 84.0  PLT 313 280 199 178 178   Basic Metabolic Panel: Recent Labs  Lab 08/31/20 0531 08/31/20 0531 09/01/20 0629 09/03/20 0643 09/04/20 0551 09/05/20 0534 09/06/20 0429  NA 133*   < > 132* 134* 129* 132* 134*  K 4.7   < > 4.9 4.9 5.1 5.1 5.5*  CL 97*   < > 96* 100 96* 97* 100  CO2 24   < > 27 23 24 26 27   GLUCOSE 271*   < > 214* 187* 303* 259* 255*  BUN 26*   < > 24* 25* 28* 24* 24*  CREATININE 0.95   < > 1.07 0.87 1.03 0.98 0.98  CALCIUM 8.4*   < > 8.2* 8.5* 8.4* 8.5* 8.5*  MG 2.6*  --  2.3  --   --   --   --    < > = values in this interval not displayed.   GFR: Estimated Creatinine Clearance: 90 mL/min (by C-G formula based on SCr of 0.98 mg/dL). Liver Function Tests: No results for input(s): AST, ALT, ALKPHOS, BILITOT, PROT, ALBUMIN in the last 168 hours. No results for input(s):  LIPASE, AMYLASE in the last 168 hours. No results for input(s): AMMONIA in the last 168 hours. Coagulation Profile: No results for input(s): INR, PROTIME in the last 168 hours. Cardiac Enzymes: No results for input(s): CKTOTAL, CKMB, CKMBINDEX, TROPONINI in the last 168 hours. BNP (last 3 results) No results for input(s): PROBNP in the last 8760 hours. HbA1C: No results for input(s): HGBA1C in the last 72 hours. CBG: Recent Labs  Lab 09/05/20 0817 09/05/20 1226 09/05/20 1632 09/05/20 2201 09/06/20 0947  GLUCAP 220* 296* 265* 232* 331*   Lipid Profile: No results for input(s): CHOL, HDL, LDLCALC, TRIG, CHOLHDL, LDLDIRECT in the last 72 hours. Thyroid Function Tests: No results for input(s): TSH, T4TOTAL, FREET4, T3FREE, THYROIDAB  in the last 72 hours. Anemia Panel: No results for input(s): VITAMINB12, FOLATE, FERRITIN, TIBC, IRON, RETICCTPCT in the last 72 hours. Sepsis Labs: No results for input(s): PROCALCITON, LATICACIDVEN in the last 168 hours.  Recent Results (from the past 240 hour(s))  Culture, blood (single) w Reflex to ID Panel     Status: None   Collection Time: 08/28/20  6:40 PM   Specimen: BLOOD  Result Value Ref Range Status   Specimen Description BLOOD BLOOD LEFT FOREARM  Final   Special Requests   Final    BOTTLES DRAWN AEROBIC AND ANAEROBIC Blood Culture adequate volume   Culture   Final    NO GROWTH 5 DAYS Performed at [facility], [address] [place], [place] [address]    Report Status 09/02/2020 FINAL  Final  Culture, sputum-assessment     Status: None   Collection Time: 08/29/20  4:51 AM   Specimen: Sputum  Result Value Ref Range Status   Specimen Description SPUTUM  Final   Special Requests NONE  Final   Sputum evaluation   Final    Sputum specimen not acceptable for testing.  Please recollect.   C/JESSICA COLTRANE AT 4098 08/29/20.PMF Performed at [facility], [address] [place], [place] [address]    Report Status  08/29/2020 FINAL  Final         Radiology Studies: No results found.      Scheduled Meds: . baricitinib  4 mg Oral Daily  . benzonatate  200 mg Oral Q8H  . enoxaparin (LOVENOX) injection  40 mg Subcutaneous Q24H  . fluticasone  2 spray Each Nare Daily  . folic acid  1 mg Oral Daily  . gabapentin  100 mg Oral BID  . insulin aspart  0-15 Units Subcutaneous TID WC  . insulin aspart  0-5 Units Subcutaneous QHS  . insulin detemir  22 Units Subcutaneous BID  . Ipratropium-Albuterol  1 puff Inhalation QID  . meloxicam  15 mg Oral Daily  . methylPREDNISolone (SOLU-MEDROL) injection  40 mg Intravenous Q12H  . multivitamin with minerals  1 tablet Oral Daily  . pantoprazole  40 mg Oral Daily   Continuous Infusions: . sodium chloride Stopped (08/30/20 1217)     LOS: 9 days    Time spent: 15 minutes    Tresa Moore, MD [facility] Pager [phone]  If 7PM-7AM, please contact night-coverage 09/06/2020, 12:08 PM

## 2020-09-06 NOTE — Plan of Care (Signed)
  Problem: Clinical Measurements: Goal: Will remain free from infection Outcome: Progressing   Problem: Safety: Goal: Ability to remain free from injury will improve Outcome: Progressing   Problem: Education: Goal: Knowledge of risk factors and measures for prevention of condition will improve Outcome: Progressing   Problem: Coping: Goal: Psychosocial and spiritual needs will be supported Outcome: Progressing   Problem: Respiratory: Goal: Will maintain a patent airway Outcome: Progressing

## 2020-09-06 NOTE — Progress Notes (Signed)
Inpatient Diabetes Program Recommendations  AACE/ADA: New Consensus Statement on Inpatient Glycemic Control (2015)  Target Ranges:  Prepandial:   less than 140 mg/dL      Peak postprandial:   less than 180 mg/dL (1-2 hours)      Critically ill patients:  140 - 180 mg/dL   Lab Results  Component Value Date   GLUCAP 331 (H) 09/06/2020   HGBA1C 8.0 (H) 08/28/2020    Review of Glycemic Control Results for TARL, CEPHAS (MRN [id number]) as of 09/06/2020 11:10  Ref. Range 09/05/2020 08:17 09/05/2020 12:26 09/05/2020 16:32 09/05/2020 22:01 09/06/2020 09:47  Glucose-Capillary Latest Ref Range: 70 - 99 mg/dL 456 (H) 256 (H) 389 (H) 232 (H) 331 (H)   Diabetes history: DM2                                                                                     Outpatient Diabetes medications: Amaryl 1 mg qd + Metformin 1 gm bid Current orders for Inpatient glycemic control: Levemir 18 units bid + Novolog moderate 0-15 tid + Novolog 5 units tid + Solumedrol 40 mg q 12 hrs.  Inpatient Diabetes Program Recommendations:     Novolog 0-20 TID Novolog 0-5 units QHS Levemir 22 units BID   Will continue to follow while inpatient.  Thank you, Dulce Sellar, RN, BSN Diabetes Coordinator Inpatient Diabetes Program [phone] (team pager from 8a-5p)

## 2020-09-07 DIAGNOSIS — U071 COVID-19: Principal | ICD-10-CM

## 2020-09-07 DIAGNOSIS — J069 Acute upper respiratory infection, unspecified: Secondary | ICD-10-CM

## 2020-09-07 LAB — BASIC METABOLIC PANEL
Anion gap: 7 (ref 5–15)
BUN: 25 mg/dL — ABNORMAL HIGH (ref 6–20)
CO2: 28 mmol/L (ref 22–32)
Calcium: 8.3 mg/dL — ABNORMAL LOW (ref 8.9–10.3)
Chloride: 101 mmol/L (ref 98–111)
Creatinine, Ser: 0.88 mg/dL (ref 0.61–1.24)
GFR, Estimated: >60 mL/min (ref >60)
Glucose, Bld: 149 mg/dL — ABNORMAL HIGH (ref 70–99)
Potassium: 4.8 mmol/L (ref 3.5–5.1)
Sodium: 136 mmol/L (ref 135–145)

## 2020-09-07 LAB — GLUCOSE, CAPILLARY
Glucose-Capillary: 108 mg/dL — ABNORMAL HIGH (ref 70–99)
Glucose-Capillary: 219 mg/dL — ABNORMAL HIGH (ref 70–99)

## 2020-09-07 LAB — C-REACTIVE PROTEIN: CRP: 0.8 mg/dL (ref <1.0)

## 2020-09-07 MED ORDER — ALBUTEROL SULFATE HFA 108 (90 BASE) MCG/ACT IN AERS: 2.0000 | INHALATION_SPRAY | Freq: Four times a day (QID) | RESPIRATORY_TRACT | 0 refills | Status: AC | PRN

## 2020-09-07 MED ORDER — GUAIFENESIN-DM 100-10 MG/5ML PO SYRP: 10.0000 mL | ORAL_SOLUTION | ORAL | 0 refills | Status: DC | PRN

## 2020-09-07 MED ORDER — ACETAMINOPHEN 325 MG PO TABS: 650.0000 mg | ORAL_TABLET | Freq: Four times a day (QID) | ORAL | Status: AC | PRN

## 2020-09-07 NOTE — Progress Notes (Signed)
Discharge instructions explained to pt/ verbalized an understanding/ iv and tele removed/ waiting on home o2 to be delivered to room

## 2020-09-07 NOTE — Progress Notes (Signed)
SATURATION QUALIFICATIONS: (This note is used to comply with regulatory documentation for home oxygen)  Patient Saturations on Room Air at Rest = 89%  Patient Saturations on Room Air while Ambulating = 88%  Patient Saturations on 2Liters of oxygen while Ambulating = 93%  Please briefly explain why patient needs home oxygen: 

## 2020-09-07 NOTE — Discharge Instructions (Signed)
10 Things You Can Do to Manage Your COVID-19 Symptoms at Home If you have possible or confirmed COVID-19: 1. Stay home from work and school. And stay away from other public places. If you must go out, avoid using any kind of public transportation, ridesharing, or taxis. 2. Monitor your symptoms carefully. If your symptoms get worse, call your healthcare provider immediately. 3. Get rest and stay hydrated. 4. If you have a medical appointment, call the healthcare provider ahead of time and tell them that you have or may have COVID-19. 5. For medical emergencies, call 911 and notify the dispatch personnel that you have or may have COVID-19. 6. Cover your cough and sneezes with a tissue or use the inside of your elbow. 7. Wash your hands often with soap and water for at least 20 seconds or clean your hands with an alcohol-based hand sanitizer that contains at least 60% alcohol. 8. As much as possible, stay in a specific room and away from other people in your home. Also, you should use a separate bathroom, if available. If you need to be around other people in or outside of the home, wear a mask. 9. Avoid sharing personal items with other people in your household, like dishes, towels, and bedding. 10. Clean all surfaces that are touched often, like counters, tabletops, and doorknobs. Use household cleaning sprays or wipes according to the label instructions. cdc.gov/coronavirus 04/02/2019 This information is not intended to replace advice given to you by your health care provider. Make sure you discuss any questions you have with your health care provider. Document Revised: 09/04/2019 Document Reviewed: 09/04/2019 Elsevier Patient Education  2020 [employer]   COVID-19 COVID-19 is a respiratory infection that is caused by a virus called severe acute respiratory syndrome coronavirus 2 (SARS-CoV-2). The disease is also known as coronavirus disease or novel coronavirus. In some people, the virus may  not cause any symptoms. In others, it may cause a serious infection. The infection can get worse quickly and can lead to complications, such as:  Pneumonia, or infection of the lungs.  Acute respiratory distress syndrome or ARDS. This is a condition in which fluid build-up in the lungs prevents the lungs from filling with air and passing oxygen into the blood.  Acute respiratory failure. This is a condition in which there is not enough oxygen passing from the lungs to the body or when carbon dioxide is not passing from the lungs out of the body.  Sepsis or septic shock. This is a serious bodily reaction to an infection.  Blood clotting problems.  Secondary infections due to bacteria or fungus.  Organ failure. This is when your body's organs stop working. The virus that causes COVID-19 is contagious. This means that it can spread from person to person through droplets from coughs and sneezes (respiratory secretions). What are the causes? This illness is caused by a virus. You may catch the virus by:  Breathing in droplets from an infected person. Droplets can be spread by a person breathing, speaking, singing, coughing, or sneezing.  Touching something, like a table or a doorknob, that was exposed to the virus (contaminated) and then touching your mouth, nose, or eyes. What increases the risk? Risk for infection You are more likely to be infected with this virus if you:  Are within 6 feet (2 meters) of a person with COVID-19.  Provide care for or live with a person who is infected with COVID-19.  Spend time in crowded indoor spaces or   live in shared housing. Risk for serious illness You are more likely to become seriously ill from the virus if you:  Are 50 years of age or older. The higher your age, the more you are at risk for serious illness.  Live in a nursing home or long-term care facility.  Have cancer.  Have a long-term (chronic) disease such as: ? Chronic lung disease,  including chronic obstructive pulmonary disease or asthma. ? A long-term disease that lowers your body's ability to fight infection (immunocompromised). ? Heart disease, including heart failure, a condition in which the arteries that lead to the heart become narrow or blocked (coronary artery disease), a disease which makes the heart muscle thick, weak, or stiff (cardiomyopathy). ? Diabetes. ? Chronic kidney disease. ? Sickle cell disease, a condition in which red blood cells have an abnormal "sickle" shape. ? Liver disease.  Are obese. What are the signs or symptoms? Symptoms of this condition can range from mild to severe. Symptoms may appear any time from 2 to 14 days after being exposed to the virus. They include:  A fever or chills.  A cough.  Difficulty breathing.  Headaches, body aches, or muscle aches.  Runny or stuffy (congested) nose.  A sore throat.  New loss of taste or smell. Some people may also have stomach problems, such as nausea, vomiting, or diarrhea. Other people may not have any symptoms of COVID-19. How is this diagnosed? This condition may be diagnosed based on:  Your signs and symptoms, especially if: ? You live in an area with a COVID-19 outbreak. ? You recently traveled to or from an area where the virus is common. ? You provide care for or live with a person who was diagnosed with COVID-19. ? You were exposed to a person who was diagnosed with COVID-19.  A physical exam.  Lab tests, which may include: ? Taking a sample of fluid from the back of your nose and throat (nasopharyngeal fluid), your nose, or your throat using a swab. ? A sample of mucus from your lungs (sputum). ? Blood tests.  Imaging tests, which may include, X-rays, CT scan, or ultrasound. How is this treated? At present, there is no medicine to treat COVID-19. Medicines that treat other diseases are being used on a trial basis to see if they are effective against COVID-19. Your  health care provider will talk with you about ways to treat your symptoms. For most people, the infection is mild and can be managed at home with rest, fluids, and over-the-counter medicines. Treatment for a serious infection usually takes places in a hospital intensive care unit (ICU). It may include one or more of the following treatments. These treatments are given until your symptoms improve.  Receiving fluids and medicines through an IV.  Supplemental oxygen. Extra oxygen is given through a tube in the nose, a face mask, or a hood.  Positioning you to lie on your stomach (prone position). This makes it easier for oxygen to get into the lungs.  Continuous positive airway pressure (CPAP) or bi-level positive airway pressure (BPAP) machine. This treatment uses mild air pressure to keep the airways open. A tube that is connected to a motor delivers oxygen to the body.  Ventilator. This treatment moves air into and out of the lungs by using a tube that is placed in your windpipe.  Tracheostomy. This is a procedure to create a hole in the neck so that a breathing tube can be inserted.  Extracorporeal membrane   oxygenation (ECMO). This procedure gives the lungs a chance to recover by taking over the functions of the heart and lungs. It supplies oxygen to the body and removes carbon dioxide. Follow these instructions at home: Lifestyle  If you are sick, stay home except to get medical care. Your health care provider will tell you how long to stay home. Call your health care provider before you go for medical care.  Rest at home as told by your health care provider.  Do not use any products that contain nicotine or tobacco, such as cigarettes, e-cigarettes, and chewing tobacco. If you need help quitting, ask your health care provider.  Return to your normal activities as told by your health care provider. Ask your health care provider what activities are safe for you. General  instructions  Take over-the-counter and prescription medicines only as told by your health care provider.  Drink enough fluid to keep your urine pale yellow.  Keep all follow-up visits as told by your health care provider. This is important. How is this prevented?  There is no vaccine to help prevent COVID-19 infection. However, there are steps you can take to protect yourself and others from this virus. To protect yourself:   Do not travel to areas where COVID-19 is a risk. The areas where COVID-19 is reported change often. To identify high-risk areas and travel restrictions, check the CDC travel website: wwwnc.cdc.gov/travel/notices  If you live in, or must travel to, an area where COVID-19 is a risk, take precautions to avoid infection. ? Stay away from people who are sick. ? Wash your hands often with soap and water for 20 seconds. If soap and water are not available, use an alcohol-based hand sanitizer. ? Avoid touching your mouth, face, eyes, or nose. ? Avoid going out in public, follow guidance from your state and local health authorities. ? If you must go out in public, wear a cloth face covering or face mask. Make sure your mask covers your nose and mouth. ? Avoid crowded indoor spaces. Stay at least 6 feet (2 meters) away from others. ? Disinfect objects and surfaces that are frequently touched every day. This may include:  Counters and tables.  Doorknobs and light switches.  Sinks and faucets.  Electronics, such as phones, remote controls, keyboards, computers, and tablets. To protect others: If you have symptoms of COVID-19, take steps to prevent the virus from spreading to others.  If you think you have a COVID-19 infection, contact your health care provider right away. Tell your health care team that you think you may have a COVID-19 infection.  Stay home. Leave your house only to seek medical care. Do not use public transport.  Do not travel while you are  sick.  Wash your hands often with soap and water for 20 seconds. If soap and water are not available, use alcohol-based hand sanitizer.  Stay away from other members of your household. Let healthy household members care for children and pets, if possible. If you have to care for children or pets, wash your hands often and wear a mask. If possible, stay in your own room, separate from others. Use a different bathroom.  Make sure that all people in your household wash their hands well and often.  Cough or sneeze into a tissue or your sleeve or elbow. Do not cough or sneeze into your hand or into the air.  Wear a cloth face covering or face mask. Make sure your mask covers your nose   and mouth. Where to find more information  Centers for Disease Control and Prevention: www.cdc.gov/coronavirus/2019-ncov/index.html  World Health Organization: www.who.int/health-topics/coronavirus Contact a health care provider if:  You live in or have traveled to an area where COVID-19 is a risk and you have symptoms of the infection.  You have had contact with someone who has COVID-19 and you have symptoms of the infection. Get help right away if:  You have trouble breathing.  You have pain or pressure in your chest.  You have confusion.  You have bluish lips and fingernails.  You have difficulty waking from sleep.  You have symptoms that get worse. These symptoms may represent a serious problem that is an emergency. Do not wait to see if the symptoms will go away. Get medical help right away. Call your local emergency services (911 in the [place] Do not drive yourself to the hospital. Let the emergency medical personnel know if you think you have COVID-19. Summary  COVID-19 is a respiratory infection that is caused by a virus. It is also known as coronavirus disease or novel coronavirus. It can cause serious infections, such as pneumonia, acute respiratory distress syndrome, acute respiratory failure,  or sepsis.  The virus that causes COVID-19 is contagious. This means that it can spread from person to person through droplets from breathing, speaking, singing, coughing, or sneezing.  You are more likely to develop a serious illness if you are 50 years of age or older, have a weak immune system, live in a nursing home, or have chronic disease.  There is no medicine to treat COVID-19. Your health care provider will talk with you about ways to treat your symptoms.  Take steps to protect yourself and others from infection. Wash your hands often and disinfect objects and surfaces that are frequently touched every day. Stay away from people who are sick and wear a mask if you are sick. This information is not intended to replace advice given to you by your health care provider. Make sure you discuss any questions you have with your health care provider. Document Revised: 07/18/2019 Document Reviewed: 10/24/2018 Elsevier Patient Education  2020 [employer] Inc.  COVID-19: How to Protect Yourself and Others Know how it spreads  There is currently no vaccine to prevent coronavirus disease 2019 (COVID-19).  The best way to prevent illness is to avoid being exposed to this virus.  The virus is thought to spread mainly from person-to-person. ? Between people who are in close contact with one another (within about 6 feet). ? Through respiratory droplets produced when an infected person coughs, sneezes or talks. ? These droplets can land in the mouths or noses of people who are nearby or possibly be inhaled into the lungs. ? COVID-19 may be spread by people who are not showing symptoms. Everyone should Clean your hands often  Wash your hands often with soap and water for at least 20 seconds especially after you have been in a public place, or after blowing your nose, coughing, or sneezing.  If soap and water are not readily available, use a hand sanitizer that contains at least 60% alcohol. Cover  all surfaces of your hands and rub them together until they feel dry.  Avoid touching your eyes, nose, and mouth with unwashed hands. Avoid close contact  Limit contact with others as much as possible.  Avoid close contact with people who are sick.  Put distance between yourself and other people. ? Remember that some people without symptoms may be   able to spread virus. ? This is especially important for people who are at higher risk of getting very sick.www.cdc.gov/coronavirus/2019-ncov/need-extra-precautions/people-at-higher-risk.html Cover your mouth and nose with a mask when around others  You could spread COVID-19 to others even if you do not feel sick.  Everyone should wear a mask in public settings and when around people not living in their household, especially when social distancing is difficult to maintain. ? Masks should not be placed on young children under age 2, anyone who has trouble breathing, or is unconscious, incapacitated or otherwise unable to remove the mask without assistance.  The mask is meant to protect other people in case you are infected.  Do NOT use a facemask meant for a healthcare worker.  Continue to keep about 6 feet between yourself and others. The mask is not a substitute for social distancing. Cover coughs and sneezes  Always cover your mouth and nose with a tissue when you cough or sneeze or use the inside of your elbow.  Throw used tissues in the trash.  Immediately wash your hands with soap and water for at least 20 seconds. If soap and water are not readily available, clean your hands with a hand sanitizer that contains at least 60% alcohol. Clean and disinfect  Clean AND disinfect frequently touched surfaces daily. This includes tables, doorknobs, light switches, countertops, handles, desks, phones, keyboards, toilets, faucets, and sinks. www.cdc.gov/coronavirus/2019-ncov/prevent-getting-sick/disinfecting-your-home.html  If surfaces are  dirty, clean them: Use detergent or soap and water prior to disinfection.  Then, use a household disinfectant. You can see a list of EPA-registered household disinfectants here. cdc.gov/coronavirus 06/04/2019 This information is not intended to replace advice given to you by your health care provider. Make sure you discuss any questions you have with your health care provider. Document Revised: 06/12/2019 Document Reviewed: 04/10/2019 Elsevier Patient Education  2020 [employer]   COVID-19 Frequently Asked Questions COVID-19 (coronavirus disease) is an infection that is caused by a large family of viruses. Some viruses cause illness in people and others cause illness in animals like camels, cats, and bats. In some cases, the viruses that cause illness in animals can spread to humans. Where did the coronavirus come from? In December 2019, [place] told the World Health Organization (WHO) of several cases of lung disease (human respiratory illness). These cases were linked to an open seafood and livestock market in the city of [place]. The link to the seafood and livestock market suggests that the virus may have spread from animals to humans. However, since that first outbreak in December, the virus has also been shown to spread from person to person. What is the name of the disease and the virus? Disease name Early on, this disease was called novel coronavirus. This is because scientists determined that the disease was caused by a new (novel) respiratory virus. The World Health Organization (WHO) has now named the disease COVID-19, or coronavirus disease. Virus name The virus that causes the disease is called severe acute respiratory syndrome coronavirus 2 (SARS-CoV-2). More information on disease and virus naming World Health Organization (WHO): www.who.int/emergencies/diseases/novel-coronavirus-2019/technical-guidance/naming-the-coronavirus-disease-(covid-2019)-and-the-virus-that-causes-it Who is  at risk for complications from coronavirus disease? Some people may be at higher risk for complications from coronavirus disease. This includes older adults and people who have chronic diseases, such as heart disease, diabetes, and lung disease. If you are at higher risk for complications, take these extra precautions:  Stay home as much as possible.  Avoid social gatherings and travel.  Avoid close contact with others. Stay   at least 6 ft (2 m) away from others, if possible.  Wash your hands often with soap and water for at least 20 seconds.  Avoid touching your face, mouth, nose, or eyes.  Keep supplies on hand at home, such as food, medicine, and cleaning supplies.  If you must go out in public, wear a cloth face covering or face mask. Make sure your mask covers your nose and mouth. How does coronavirus disease spread? The virus that causes coronavirus disease spreads easily from person to person (is contagious). You may catch the virus by:  Breathing in droplets from an infected person. Droplets can be spread by a person breathing, speaking, singing, coughing, or sneezing.  Touching something, like a table or a doorknob, that was exposed to the virus (contaminated) and then touching your mouth, nose, or eyes. Can I get the virus from touching surfaces or objects? There is still a lot that we do not know about the virus that causes coronavirus disease. Scientists are basing a lot of information on what they know about similar viruses, such as:  Viruses cannot generally survive on surfaces for long. They need a human body (host) to survive.  It is more likely that the virus is spread by close contact with people who are sick (direct contact), such as through: ? Shaking hands or hugging. ? Breathing in respiratory droplets that travel through the air. Droplets can be spread by a person breathing, speaking, singing, coughing, or sneezing.  It is less likely that the virus is spread  when a person touches a surface or object that has the virus on it (indirect contact). The virus may be able to enter the body if the person touches a surface or object and then touches his or her face, eyes, nose, or mouth. Can a person spread the virus without having symptoms of the disease? It may be possible for the virus to spread before a person has symptoms of the disease, but this is most likely not the main way the virus is spreading. It is more likely for the virus to spread by being in close contact with people who are sick and breathing in the respiratory droplets spread by a person breathing, speaking, singing, coughing, or sneezing. What are the symptoms of coronavirus disease? Symptoms vary from person to person and can range from mild to severe. Symptoms may include:  Fever or chills.  Cough.  Difficulty breathing or feeling short of breath.  Headaches, body aches, or muscle aches.  Runny or stuffy (congested) nose.  Sore throat.  New loss of taste or smell.  Nausea, vomiting, or diarrhea. These symptoms can appear anywhere from 2 to 14 days after you have been exposed to the virus. Some people may not have any symptoms. If you develop symptoms, call your health care provider. People with severe symptoms may need hospital care. Should I be tested for this virus? Your health care provider will decide whether to test you based on your symptoms, history of exposure, and your risk factors. How does a health care provider test for this virus? Health care providers will collect samples to send for testing. Samples may include:  Taking a swab of fluid from the back of your nose and throat, your nose, or your throat.  Taking fluid from the lungs by having you cough up mucus (sputum) into a sterile cup.  Taking a blood sample. Is there a treatment or vaccine for this virus? Currently, there is no vaccine to   prevent coronavirus disease. Also, there are no medicines like  antibiotics or antivirals to treat the virus. A person who becomes sick is given supportive care, which means rest and fluids. A person may also relieve his or her symptoms by using over-the-counter medicines that treat sneezing, coughing, and runny nose. These are the same medicines that a person takes for the common cold. If you develop symptoms, call your health care provider. People with severe symptoms may need hospital care. What can I do to protect myself and my family from this virus?     You can protect yourself and your family by taking the same actions that you would take to prevent the spread of other viruses. Take the following actions:  Wash your hands often with soap and water for at least 20 seconds. If soap and water are not available, use alcohol-based hand sanitizer.  Avoid touching your face, mouth, nose, or eyes.  Cough or sneeze into a tissue, sleeve, or elbow. Do not cough or sneeze into your hand or the air. ? If you cough or sneeze into a tissue, throw it away immediately and wash your hands.  Disinfect objects and surfaces that you frequently touch every day.  Stay away from people who are sick.  Avoid going out in public, follow guidance from your state and local health authorities.  Avoid crowded indoor spaces. Stay at least 6 ft (2 m) away from others.  If you must go out in public, wear a cloth face covering or face mask. Make sure your mask covers your nose and mouth.  Stay home if you are sick, except to get medical care. Call your health care provider before you get medical care. Your health care provider will tell you how long to stay home.  Make sure your vaccines are up to date. Ask your health care provider what vaccines you need. What should I do if I need to travel? Follow travel recommendations from your local health authority, the CDC, and WHO. Travel information and advice  Centers for Disease Control and Prevention (CDC):  www.cdc.gov/coronavirus/2019-ncov/travelers/index.html  World Health Organization (WHO): www.who.int/emergencies/diseases/novel-coronavirus-2019/travel-advice Know the risks and take action to protect your health  You are at higher risk of getting coronavirus disease if you are traveling to areas with an outbreak or if you are exposed to travelers from areas with an outbreak.  Wash your hands often and practice good hygiene to lower the risk of catching or spreading the virus. What should I do if I am sick? General instructions to stop the spread of infection  Wash your hands often with soap and water for at least 20 seconds. If soap and water are not available, use alcohol-based hand sanitizer.  Cough or sneeze into a tissue, sleeve, or elbow. Do not cough or sneeze into your hand or the air.  If you cough or sneeze into a tissue, throw it away immediately and wash your hands.  Stay home unless you must get medical care. Call your health care provider or local health authority before you get medical care.  Avoid public areas. Do not take public transportation, if possible.  If you can, wear a mask if you must go out of the house or if you are in close contact with someone who is not sick. Make sure your mask covers your nose and mouth. Keep your home clean  Disinfect objects and surfaces that are frequently touched every day. This may include: ? Counters and tables. ? Doorknobs and light   switches. ? Sinks and faucets. ? Electronics such as phones, remote controls, keyboards, computers, and tablets.  Wash dishes in hot, soapy water or use a dishwasher. Air-dry your dishes.  Wash laundry in hot water. Prevent infecting other household members  Let healthy household members care for children and pets, if possible. If you have to care for children or pets, wash your hands often and wear a mask.  Sleep in a different bedroom or bed, if possible.  Do not share personal items, such  as razors, toothbrushes, deodorant, combs, brushes, towels, and washcloths. Where to find more information Centers for Disease Control and Prevention (CDC)  Information and news updates: www.cdc.gov/coronavirus/2019-ncov World Health Organization (WHO)  Information and news updates: www.who.int/emergencies/diseases/novel-coronavirus-2019  Coronavirus health topic: www.who.int/health-topics/coronavirus  Questions and answers on COVID-19: www.who.int/news-room/q-a-detail/q-a-coronaviruses  Global tracker: who.sprinklr.com American Academy of Pediatrics (AAP)  Information for families: www.healthychildren.org/English/health-issues/conditions/chest-lungs/Pages/2019-Novel-Coronavirus.aspx The coronavirus situation is changing rapidly. Check your local health authority website or the CDC and WHO websites for updates and news. When should I contact a health care provider?  Contact your health care provider if you have symptoms of an infection, such as fever or cough, and you: ? Have been near anyone who is known to have coronavirus disease. ? Have come into contact with a person who is suspected to have coronavirus disease. ? Have traveled to an area where there is an outbreak of COVID-19. When should I get emergency medical care?  Get help right away by calling your local emergency services (911 in the [place] if you have: ? Trouble breathing. ? Pain or pressure in your chest. ? Confusion. ? Blue-tinged lips and fingernails. ? Difficulty waking from sleep. ? Symptoms that get worse. Let the emergency medical personnel know if you think you have coronavirus disease. Summary  A new respiratory virus is spreading from person to person and causing COVID-19 (coronavirus disease).  The virus that causes COVID-19 appears to spread easily. It spreads from one person to another through droplets from breathing, speaking, singing, coughing, or sneezing.  Older adults and those with chronic  diseases are at higher risk of disease. If you are at higher risk for complications, take extra precautions.  There is currently no vaccine to prevent coronavirus disease. There are no medicines, such as antibiotics or antivirals, to treat the virus.  You can protect yourself and your family by washing your hands often, avoiding touching your face, and covering your coughs and sneezes. This information is not intended to replace advice given to you by your health care provider. Make sure you discuss any questions you have with your health care provider. Document Revised: 07/18/2019 Document Reviewed: 01/14/2019 Elsevier Patient Education  2020 Elsevier Inc.  

## 2020-09-07 NOTE — TOC Progression Note (Signed)
Transition of Care [facility]) - Progression Note    Patient Details  Name: Gregory Johnston MRN: [id number] Date of Birth: Feb 03, 1966  Transition of Care [facility]) CM/SW Contact  Shawn Route, RN Phone Number: 09/07/2020, 1:49 PM  Clinical Narrative:     Patient qualified for [facility].  Patient has no insurance listed.  Jerolyn Center with [employer] for charity oxygen.  He is working on getting patient oxygen at this time.        Expected Discharge Plan and Services           Expected Discharge Date: 09/07/20                   Social Determinants of Health (SDOH) Interventions    Readmission Risk Interventions No flowsheet data found.

## 2020-09-07 NOTE — Discharge Summary (Signed)
Physician Discharge Summary  Gregory Johnston:[id number] DOB: 11-Dec-1965 DOA: 08/28/2020  PCP: Barbette Reichmann, MD  Admit date: 08/28/2020 Discharge date: 09/07/2020  Admitted From: Home Disposition:  Home  Recommendations for Outpatient Follow-up:  1. Follow up with PCP in 1-2 weeks   Home Health:No Equipment/Devices: Oxygen 2 L  discharge Condition: Stable CODE STATUS: Full Diet recommendation: Carb modified  Brief/Interim Summary: Gregory Johnston a 54 y.o.malewith medical history significant for type 2 diabetes mellitus and obstructive sleep apnea. He is not up-to-date with his Covid vaccination and presented to his primary care physician on 08/25/2020 with complaints of symptoms of generalized malaise, cough and with shortness of breath after contact with wife tested positive for Covid. Patient also tested positive on the daybut was deemed stable for conservative management at home.Today, patient presents due to worsening shortness of breath with minimalexertion.Symptoms have been associated with nonproductive cough,fever and chills. Patient was noted to be hypoxic on presentation and required 6 L/min of oxygen at triage.   As of 09/01/2020 patient remains dependent on heated high flow nasal cannula 60 L.  He has normal work of breathing.  He is mentating clearly. 12/3: Weaned off heated high flow nasal cannula.  Now on 15 L high flow saturating well.  Healthy appetite.  No respiratory distress 12/4: Weaned to 12 L high flow nasal cannula 12/5: Continues to improve.  Down to 7-8 L high flow nasal cannula.  Mentating clearly.  Normal work of breathing. 12/6: Respiratory status improving.  Weaned to 6 L high flow nasal cannula.  Mentating clearly.  Normal work of breathing 12/7: Respiratory status improved substantially.  On 2 L nasal cannula.  Ambulated around room and desaturated to 88%.  Requires home oxygen at 2 L via nasal cannula.  Completed steroid and  remdesivir course in house.  Stable for discharge home at this time.  As needed albuterol MDI and Tessalon Perles prescribed on discharge.  Remainder home regimen unchanged  Discharge Diagnoses:  Active Problems:   Acute respiratory disease due to COVID-19 virus  Multifocal pneumonia due to COVID-19 Acute hypoxic respiratory failure -Oxygen requirement on presentation is acutely worsened to 60 L -Elevated inflammatory markers and multifocal opacities consistent with known diagnosis -Normal work of breathing, intact mentation -Weaned off heated high flow, now on 8 L high flow Completed course of remdesivir Completed course of steroids in house 10 days of baricitinib Supplemental oxygen needed on discharge, 2 L   Diabetes mellitus type 2 poorly controlled Hyperglycemia exacerbated by steroid --A1c 8.0 Can resume home regimen on discharge Outpatient PCP follow-up   History of obstructive sleep apnea: Noncompliant with CPAP at home. --Hold CPAP due to COVID   Hyperkalemia, resolved Etiology unclear. No EKG changes. Patient is asymptomatic. --s/pKayexalate 15g x1 --Monitor and treat PRN  Discharge Instructions  Discharge Instructions    Diet - low sodium heart healthy   Complete by: As directed    Increase activity slowly   Complete by: As directed      Allergies as of 09/07/2020   No Known Allergies     Medication List    STOP taking these medications   azithromycin 250 MG tablet Commonly known as: ZITHROMAX   predniSONE 20 MG tablet Commonly known as: DELTASONE     TAKE these medications   acetaminophen 325 MG tablet Commonly known as: TYLENOL Take 2 tablets (650 mg total) by mouth every 6 (six) hours as needed for mild pain or headache (fever >/= 101).  albuterol 108 (90 Base) MCG/ACT inhaler Commonly known as: VENTOLIN HFA Inhale 2 puffs into the lungs every 6 (six) hours as needed for wheezing or shortness of breath.   benzonatate 200 MG  capsule Commonly known as: TESSALON Take 200 mg by mouth every 8 (eight) hours as needed for cough.   gabapentin 100 MG capsule Commonly known as: NEURONTIN Take 100 mg by mouth 2 (two) times daily.   glimepiride 1 MG tablet Commonly known as: AMARYL Take 1 mg by mouth daily with breakfast.   guaiFENesin-dextromethorphan 100-10 MG/5ML syrup Commonly known as: ROBITUSSIN DM Take 10 mLs by mouth every 4 (four) hours as needed for cough.   meloxicam 15 MG tablet Commonly known as: MOBIC Take 15 mg by mouth daily.   metFORMIN 1000 MG tablet Commonly known as: GLUCOPHAGE Take 1,000 mg by mouth 2 (two) times daily with a meal.   omeprazole 20 MG capsule Commonly known as: PRILOSEC Take 20 mg by mouth daily.   ondansetron 4 MG disintegrating tablet Commonly known as: ZOFRAN-ODT Take 4 mg by mouth every 8 (eight) hours as needed for nausea or vomiting.            Durable Medical Equipment  (From admission, onward)         Start     Ordered   09/07/20 1244  For home use only DME oxygen  Once       Question Answer Comment  Length of Need 6 Months   Mode or (Route) Nasal cannula   Liters per Minute 2   Frequency Continuous (stationary and portable oxygen unit needed)   Oxygen conserving device Yes   Oxygen delivery system Gas      09/07/20 1243          Follow-up Information    Barbette Reichmann, MD. Schedule an appointment as soon as possible for a visit in 1 week.   Specialty: Internal Medicine Why: Patient will need to make a follow up appointment. Contact information: [address] [place] [address] [phone]              No Known Allergies  Consultations:  None   Procedures/Studies: DG Chest Portable 1 View  Result Date: 08/28/2020 CLINICAL DATA:  COVID positive with hypoxia. EXAM: PORTABLE CHEST 1 VIEW COMPARISON:  Report from chest radiograph dated 08/25/2020. FINDINGS: The heart size is normal.  Moderate patchy bilateral airspace opacities are noted. There is no pleural effusion or pneumothorax. The osseous structures are intact. IMPRESSION: Moderate patchy bilateral airspace opacities are consistent with COVID-19 pneumonia. Electronically Signed   By: Romona Curls M.D.   On: 08/28/2020 15:46    (Echo, Carotid, EGD, Colonoscopy, ERCP)    Subjective: Seen and examined.  Stable on day of discharge.  Ambulated with desaturation to 88%.  Home oxygen ordered  Discharge Exam: Vitals:   09/07/20 0838 09/07/20 1200  BP: 104/69 124/72  Pulse: 62   Resp: 18 19  Temp: 98.5 F (36.9 C) 98.1 F (36.7 C)  SpO2: 91% 91%   Vitals:   09/06/20 2013 09/07/20 0403 09/07/20 0838 09/07/20 1200  BP: 124/64 (!) 115/94 104/69 124/72  Pulse: 62 61 62   Resp: 18 18 18 19   Temp: 98.8 F (37.1 C) 98.8 F (37.1 C) 98.5 F (36.9 C) 98.1 F (36.7 C)  TempSrc: Oral Oral  Oral  SpO2: 97% 95% 91% 91%  Weight:  85.3 kg    Height:  General: Pt is alert, awake, not in acute distress Cardiovascular: RRR, S1/S2 +, no rubs, no gallops Respiratory: Mild bibasilar crackles.  Normal work of breathing.  2 L Abdominal: Soft, NT, ND, bowel sounds + Extremities: no edema, no cyanosis    The results of significant diagnostics from this hospitalization (including imaging, microbiology, ancillary and laboratory) are listed below for reference.     Microbiology: Recent Results (from the past 240 hour(s))  Culture, blood (single) w Reflex to ID Panel     Status: None   Collection Time: 08/28/20  6:40 PM   Specimen: BLOOD  Result Value Ref Range Status   Specimen Description BLOOD BLOOD LEFT FOREARM  Final   Special Requests   Final    BOTTLES DRAWN AEROBIC AND ANAEROBIC Blood Culture adequate volume   Culture   Final    NO GROWTH 5 DAYS Performed at [facility], [address] [place], [place] [address]    Report Status 09/02/2020 FINAL  Final  Culture, sputum-assessment      Status: None   Collection Time: 08/29/20  4:51 AM   Specimen: Sputum  Result Value Ref Range Status   Specimen Description SPUTUM  Final   Special Requests NONE  Final   Sputum evaluation   Final    Sputum specimen not acceptable for testing.  Please recollect.   C/JESSICA COLTRANE AT [id number] 08/29/20.PMF Performed at [facility], [address] [place], [place] [address]    Report Status 08/29/2020 FINAL  Final     Labs: BNP (last 3 results) No results for input(s): BNP in the last 8760 hours. Basic Metabolic Panel: Recent Labs  Lab 09/01/20 0629 09/01/20 0629 09/03/20 0643 09/04/20 0551 09/05/20 0534 09/06/20 0429 09/07/20 0524  NA 132*   < > 134* 129* 132* 134* 136  K 4.9   < > 4.9 5.1 5.1 5.5* 4.8  CL 96*   < > 100 96* 97* 100 101  CO2 27   < > 23 24 26 27 28   GLUCOSE 214*   < > 187* 303* 259* 255* 149*  BUN 24*   < > 25* 28* 24* 24* 25*  CREATININE 1.07   < > 0.87 1.03 0.98 0.98 0.88  CALCIUM 8.2*   < > 8.5* 8.4* 8.5* 8.5* 8.3*  MG 2.3  --   --   --   --   --   --    < > = values in this interval not displayed.   Liver Function Tests: No results for input(s): AST, ALT, ALKPHOS, BILITOT, PROT, ALBUMIN in the last 168 hours. No results for input(s): LIPASE, AMYLASE in the last 168 hours. No results for input(s): AMMONIA in the last 168 hours. CBC: Recent Labs  Lab 09/01/20 0629 09/03/20 0643 09/04/20 0551 09/05/20 0534  WBC 9.4 12.2* 13.0* 12.2*  NEUTROABS  --  11.2* 12.2* 11.4*  HGB 14.1 14.1 13.0 13.4  HCT 39.9 38.9* 36.8* 37.8*  MCV 84.0 84.4 83.4 84.0  PLT 280 199 178 178   Cardiac Enzymes: No results for input(s): CKTOTAL, CKMB, CKMBINDEX, TROPONINI in the last 168 hours. BNP: Invalid input(s): POCBNP CBG: Recent Labs  Lab 09/06/20 1220 09/06/20 1600 09/06/20 2013 09/07/20 0834 09/07/20 1211  GLUCAP 266* 359* 310* 108* 219*   D-Dimer No results for input(s): DDIMER in the last 72 hours. Hgb A1c No results for input(s): HGBA1C in  the last 72 hours. Lipid Profile No results for input(s): CHOL, HDL, LDLCALC, TRIG, CHOLHDL,  LDLDIRECT in the last 72 hours. Thyroid function studies No results for input(s): TSH, T4TOTAL, T3FREE, THYROIDAB in the last 72 hours.  Invalid input(s): FREET3 Anemia work up No results for input(s): VITAMINB12, FOLATE, FERRITIN, TIBC, IRON, RETICCTPCT in the last 72 hours. Urinalysis No results found for: COLORURINE, APPEARANCEUR, LABSPEC, PHURINE, GLUCOSEU, HGBUR, BILIRUBINUR, KETONESUR, PROTEINUR, UROBILINOGEN, NITRITE, LEUKOCYTESUR Sepsis Labs Invalid input(s): PROCALCITONIN,  WBC,  LACTICIDVEN Microbiology Recent Results (from the past 240 hour(s))  Culture, blood (single) w Reflex to ID Panel     Status: None   Collection Time: 08/28/20  6:40 PM   Specimen: BLOOD  Result Value Ref Range Status   Specimen Description BLOOD BLOOD LEFT FOREARM  Final   Special Requests   Final    BOTTLES DRAWN AEROBIC AND ANAEROBIC Blood Culture adequate volume   Culture   Final    NO GROWTH 5 DAYS Performed at [facility], [address] [place], [place] [address]    Report Status 09/02/2020 FINAL  Final  Culture, sputum-assessment     Status: None   Collection Time: 08/29/20  4:51 AM   Specimen: Sputum  Result Value Ref Range Status   Specimen Description SPUTUM  Final   Special Requests NONE  Final   Sputum evaluation   Final    Sputum specimen not acceptable for testing.  Please recollect.   C/JESSICA COLTRANE AT 0160 08/29/20.PMF Performed at [facility], [address] [place], [place] [address]    Report Status 08/29/2020 FINAL  Final     Time coordinating discharge: Over 30 minutes  SIGNED:   Tresa Moore, MD  [facility] 09/07/2020, 3:05 PM Pager   If 7PM-7AM, please contact night-coverage

## 2020-10-11 DIAGNOSIS — R0902 Hypoxemia: Secondary | ICD-10-CM | POA: Insufficient documentation

## 2020-10-11 DIAGNOSIS — Z8616 Personal history of COVID-19: Secondary | ICD-10-CM | POA: Insufficient documentation

## 2021-01-17 DIAGNOSIS — J849 Interstitial pulmonary disease, unspecified: Secondary | ICD-10-CM | POA: Insufficient documentation

## 2021-01-19 ENCOUNTER — Ambulatory Visit: Payer: BC Managed Care – PPO | Admitting: Podiatry

## 2021-01-19 ENCOUNTER — Other Ambulatory Visit: Payer: Self-pay

## 2021-01-19 ENCOUNTER — Encounter: Payer: Self-pay | Admitting: Podiatry

## 2021-01-19 DIAGNOSIS — L603 Nail dystrophy: Secondary | ICD-10-CM

## 2021-01-19 DIAGNOSIS — L600 Ingrowing nail: Secondary | ICD-10-CM

## 2021-01-19 MED ORDER — NEOMYCIN-POLYMYXIN-HC 1 % OT SOLN: OTIC | 1 refills | Status: AC

## 2021-01-19 NOTE — Progress Notes (Signed)
Subjective:  Patient ID: Gregory Johnston, male    DOB: 1966/06/07,  MRN: [id number] HPI Chief Complaint  Patient presents with  . Toe Pain    Hallux bilateral - both border, ingrown x 6 months, been digging them out with his pocket knife  . Diabetes    Last a1c was 8.0   . New Patient (Initial Visit)    55 y.o. male presents with the above complaint.   ROS: Denies fever chills nausea vomiting muscle aches pains calf pain back pain chest pain shortness of breath.  He does wear oxygen due to COVID.  Past Medical History:  Diagnosis Date  . Diabetes mellitus without complication ([place])   . Sleep apnea    Past Surgical History:  Procedure Laterality Date  . CHOLECYSTECTOMY      Current Outpatient Medications:  .  Cyanocobalamin (B-12 PO), Take by mouth., Disp: , Rfl:  .  Lancets (ONETOUCH DELICA PLUS [id number]) MISC, , Disp: , Rfl:  .  NEOMYCIN-POLYMYXIN-HYDROCORTISONE (CORTISPORIN) 1 % SOLN OTIC solution, Apply 1-2 drops to toe BID after soaking, Disp: 10 mL, Rfl: 1 .  omeprazole (PRILOSEC) 20 MG capsule, Take 20 mg by mouth daily., Disp: , Rfl:  .  acetaminophen (TYLENOL) 325 MG tablet, Take 2 tablets (650 mg total) by mouth every 6 (six) hours as needed for mild pain or headache (fever >/= 101)., Disp: , Rfl:  .  albuterol (VENTOLIN HFA) 108 (90 Base) MCG/ACT inhaler, Inhale 2 puffs into the lungs every 6 (six) hours as needed for wheezing or shortness of breath., Disp: 8 g, Rfl: 0 .  Blood Glucose Monitoring Suppl (ONE TOUCH ULTRA 2) w/Device KIT, 2 (two) times daily. as directed, Disp: , Rfl:  .  FLOVENT HFA 44 MCG/ACT inhaler, SMARTSIG:2 Inhalation Via Inhaler Twice Daily, Disp: , Rfl:  .  gabapentin (NEURONTIN) 100 MG capsule, Take 100 mg by mouth 2 (two) times daily., Disp: , Rfl:  .  glimepiride (AMARYL) 1 MG tablet, Take 1 mg by mouth daily with breakfast., Disp: , Rfl:  .  meloxicam (MOBIC) 15 MG tablet, Take 15 mg by mouth daily., Disp: , Rfl:  .  metFORMIN (GLUCOPHAGE)  1000 MG tablet, Take 1,000 mg by mouth 2 (two) times daily with a meal., Disp: , Rfl:  .  ONETOUCH ULTRA test strip, USE TO TEST TWICE DAILY, Disp: , Rfl:  .  sildenafil (VIAGRA) 100 MG tablet, PLEASE SEE ATTACHED FOR DETAILED DIRECTIONS, Disp: , Rfl:   No Known Allergies Review of Systems Objective:  There were no vitals filed for this visit.  General: Well developed, nourished, in no acute distress, alert and oriented x3   Dermatological: Skin is warm, dry and supple bilateral. Nails x 10 are well maintained; remaining integument appears unremarkable at this time. There are no open sores, no preulcerative lesions, no rash or signs of infection present.  His hallux nails bilateral are sharply incurvated tender on palpation mild erythema right foot.  No purulence no malodor  Vascular: Dorsalis Pedis artery and Posterior Tibial artery pedal pulses are 2/4 bilateral with immedate capillary fill time. Pedal hair growth present. No varicosities and no lower extremity edema present bilateral.   Neruologic: Grossly intact via light touch bilateral. Vibratory intact via tuning fork bilateral. Protective threshold with Semmes Wienstein monofilament intact to all pedal sites bilateral. Patellar and Achilles deep tendon reflexes 2+ bilateral. No Babinski or clonus noted bilateral.   Musculoskeletal: No gross boney pedal deformities bilateral. No pain, crepitus, or limitation  noted with foot and ankle range of motion bilateral. Muscular strength 5/5 in all groups tested bilateral.  Gait: Unassisted, Nonantalgic.    Radiographs:  None taken  Assessment & Plan:   Assessment: Ingrown nail hallux bilateral  Plan: Chemical matrixectomy was performed today after local anesthetic was administered.  Tolerated procedure well without complications.  Performed with local anesthetic.  He was given both oral and written home metatarsophalangeal soaking of the toe bilaterally.  He also received a prescription  for Sporanox to be applied twice daily after soaking.  Follow-up with him in 2 weeks I did send out samples of his toenails for pathologic evaluation from the right side.     Lyriq Jarchow T. [place], [place]

## 2021-01-19 NOTE — Patient Instructions (Signed)

## 2021-02-02 ENCOUNTER — Encounter: Payer: Self-pay | Admitting: Podiatry

## 2021-02-02 ENCOUNTER — Other Ambulatory Visit: Payer: Self-pay

## 2021-02-02 ENCOUNTER — Ambulatory Visit: Payer: BC Managed Care – PPO | Admitting: Podiatry

## 2021-02-02 DIAGNOSIS — Z9889 Other specified postprocedural states: Secondary | ICD-10-CM

## 2021-02-02 DIAGNOSIS — L600 Ingrowing nail: Secondary | ICD-10-CM

## 2021-02-02 NOTE — Progress Notes (Signed)
He presents today for follow-up of his matrixectomy hallux bilaterally.  States that is doing fine.  He said he is up to walking a mile and he still soaking them in Betadine and in Epson salts.  Continues his appointment.  Objective: Vital signs are stable alert oriented x3 there is no erythema edema cellulitis drainage or odor appears to be healing very nicely.  Assessment: Continue to soak Epson salts and warm water every other day cover during the daytime leave open at bedtime continue to soak until completely better.  Is a follow-up in 3 weeks for his pathology report which was not in this week.

## 2021-02-03 ENCOUNTER — Encounter (HOSPITAL_COMMUNITY)
Admission: RE | Admit: 2021-02-03 | Discharge: 2021-02-03 | Disposition: A | Discharge status: Home / Self Care | Payer: BC Managed Care – PPO | Source: Ambulatory Visit | Attending: Pulmonary Disease | Admitting: Pulmonary Disease

## 2021-02-03 VITALS — BP 122/74 | HR 82 | Ht 68.0 in | Wt 223.1 lb

## 2021-02-03 DIAGNOSIS — J849 Interstitial pulmonary disease, unspecified: Secondary | ICD-10-CM | POA: Insufficient documentation

## 2021-02-03 NOTE — Progress Notes (Signed)
Cardiac/Pulmonary Rehab Medication Review by a Pharmacist  Does the patient  feel that his/her medications are working for him/her?  yes  Has the patient been experiencing any side effects to the medications prescribed?  no  Does the patient measure his/her own blood pressure or blood glucose at home?  yes   Does the patient have any problems obtaining medications due to transportation or finances?   no  Understanding of regimen: good Understanding of indications: good Potential of compliance: good  Questions asked to Determine Patient Understanding of Medication Regimen:  1. What is the name of the medication?  2. What is the medication used for?  3. When should it be taken?  4. How much should be taken?  5. How will you take it?  6. What side effects should you report?  Understanding Defined as: Excellent: All questions above are correct Good: Questions 1-4 are correct Fair: Questions 1-2 are correct  Poor: 1 or none of the above questions are correct   Pharmacist comments: Patient presents today for pulmonary rehab. He is s/p COVID PNA from November 2021 and now continues to have respiratory insufficiency and on chronic Oxygen. We reviewed his medications. He is compliant with his regimen and tolerating. I reviewed the use of the albuterol IH as a rescue IH if he gets short of breath. He does use a steroid, flovent IH, daily. He monitors his BS, last HGB A1C was 8. He hopes to increase his walking and exercise tolerance and not rely on portable Oxygen. No issues with current regimen.  Thanks for the opportunity to participate in the care of this patient,  Elder Cyphers, BS Loura Back, [place] Clinical Pharmacist Pager [phone] 02/03/2021 1:48 PM

## 2021-02-03 NOTE — Progress Notes (Signed)
Pulmonary Individual Treatment Plan  Patient Details  Name: Gregory Johnston MRN: [id number] Date of Birth: 12-31-1965 Referring Provider:   [place] from 02/03/2021 in [place]  Referring Provider Dr. Blythe Stanford      Initial Encounter Date:  Flowsheet Row PULMONARY REHAB OTHER RESP ORIENTATION from 02/03/2021 in [facility]  Date 02/03/21      Visit Diagnosis: Interstitial lung disease ([place])  Patient's Home Medications on Admission:   Current Outpatient Medications:  .  acetaminophen (TYLENOL) 325 MG tablet, Take 2 tablets (650 mg total) by mouth every 6 (six) hours as needed for mild pain or headache (fever >/= 101)., Disp: , Rfl:  .  albuterol (VENTOLIN HFA) 108 (90 Base) MCG/ACT inhaler, Inhale 2 puffs into the lungs every 6 (six) hours as needed for wheezing or shortness of breath., Disp: 8 g, Rfl: 0 .  Blood Glucose Monitoring Suppl (ONE TOUCH ULTRA 2) w/Device KIT, 2 (two) times daily. as directed, Disp: , Rfl:  .  Cyanocobalamin (B-12 PO), Take 1,000 mcg by mouth daily., Disp: , Rfl:  .  FLOVENT HFA 44 MCG/ACT inhaler, SMARTSIG:2 Inhalation Via Inhaler Twice Daily, Disp: , Rfl:  .  gabapentin (NEURONTIN) 100 MG capsule, Take 100 mg by mouth daily., Disp: , Rfl:  .  glimepiride (AMARYL) 1 MG tablet, Take 2 mg by mouth daily with breakfast., Disp: , Rfl:  .  Lancets (ONETOUCH DELICA PLUS [id number]) MISC, , Disp: , Rfl:  .  meloxicam (MOBIC) 15 MG tablet, Take 15 mg by mouth daily., Disp: , Rfl:  .  metFORMIN (GLUCOPHAGE) 1000 MG tablet, Take 1,000 mg by mouth 2 (two) times daily with a meal., Disp: , Rfl:  .  NEOMYCIN-POLYMYXIN-HYDROCORTISONE (CORTISPORIN) 1 % SOLN OTIC solution, Apply 1-2 drops to toe BID after soaking, Disp: 10 mL, Rfl: 1 .  omeprazole (PRILOSEC) 20 MG capsule, Take 20 mg by mouth daily., Disp: , Rfl:  .  ONETOUCH ULTRA test strip, USE TO TEST TWICE DAILY, Disp: , Rfl:  .   sildenafil (VIAGRA) 100 MG tablet, PLEASE SEE ATTACHED FOR DETAILED DIRECTIONS, Disp: , Rfl:   Past Medical History: Past Medical History:  Diagnosis Date  . Diabetes mellitus without complication ([place])   . 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 Latest Ref Rng & Units 08/28/2020   Hemoglobin A1c 4.8 - 5.6 % 8.0(H)      Capillary Blood Glucose: Lab Results  Component Value Date   GLUCAP 219 (H) 09/07/2020   GLUCAP 108 (H) 09/07/2020   GLUCAP 310 (H) 09/06/2020   GLUCAP 359 (H) 09/06/2020   GLUCAP 266 (H) 09/06/2020    POCT Glucose    Row Name 02/03/21 1311             POCT Blood Glucose   Pre-Exercise 190 mg/dL              Pulmonary Assessment Scores:  Pulmonary Assessment Scores    Row Name 02/03/21 1256         ADL UCSD   SOB Score total 70           CAT Score   CAT Score 21           mMRC Score   mMRC Score  1           UCSD: Self-administered rating of dyspnea associated with activities of daily living (ADLs) 6-point scale (0 = "not at all" to 5 = "maximal or unable to do because of breathlessness")  Scoring Scores range from 0 to 120.  Minimally important difference is 5 units  CAT: CAT can identify the health impairment of COPD patients and is better correlated with disease progression.  CAT has a scoring range of zero to 40. The CAT score is classified into four groups of low (less than 10), medium (10 - 20), high (21-30) and very high (31-40) based on the impact level of disease on health status. A CAT score over 10 suggests significant symptoms.  A worsening CAT score could be explained by an exacerbation, poor medication adherence, poor inhaler technique, or progression of COPD or comorbid conditions.  CAT MCID is 2 points  mMRC: mMRC (Modified Medical Research Council)  Dyspnea Scale is used to assess the degree of baseline functional disability in patients of respiratory disease due to dyspnea. No minimal important difference is established. A decrease in score of 1 point or greater is considered a positive change.   Pulmonary Function Assessment:  Pulmonary Function Assessment - 02/03/21 1256      Breath   Shortness of Breath Yes;Limiting activity           Exercise Target Goals: Exercise Program Goal: Individual exercise prescription set using results from initial 6 min walk test and THRR while considering  patient's activity barriers and safety.   Exercise Prescription Goal: Initial exercise prescription builds to 30-45 minutes a day of aerobic activity, 2-3 days per week.  Home exercise guidelines will be given to patient during program as part of exercise prescription that the participant will acknowledge.  Activity Barriers & Risk Stratification:  Activity Barriers & Cardiac Risk Stratification - 02/03/21 1258      Activity Barriers & Cardiac Risk Stratification   Activity Barriers Arthritis;Joint Problems;Shortness of Breath    Cardiac Risk Stratification Low           6 Minute Walk:  6 Minute Walk    Row Name 02/03/21 1426         6 Minute Walk   Phase Initial     Distance 1200 feet     Walk Time 6 minutes     # of Rest Breaks 0     MPH 2.3     METS 3.25     RPE 12     Perceived Dyspnea  12     VO2 Peak 11.37     Symptoms No     Resting HR 82 bpm     Resting BP 122/74     Resting Oxygen Saturation  97 %     Exercise Oxygen Saturation  during 6 min walk 93 %     Max Ex. HR 102 bpm     Max Ex. BP 152/76     2 Minute Post BP 148/78           Interval HR   1 Minute HR 93     2 Minute HR 97     3 Minute HR 98     4 Minute HR 98     5 Minute HR 100     6 Minute HR 102     2 Minute Post HR 83     Interval Heart Rate? Yes  Interval Oxygen   Interval Oxygen? Yes     Baseline Oxygen Saturation % 97 %      1 Minute Oxygen Saturation % 97 %     1 Minute Liters of Oxygen 2 L     2 Minute Oxygen Saturation % 94 %     2 Minute Liters of Oxygen 2 L     3 Minute Oxygen Saturation % 93 %     3 Minute Liters of Oxygen 2 L     4 Minute Oxygen Saturation % 94 %     4 Minute Liters of Oxygen 2 L     5 Minute Oxygen Saturation % 94 %     5 Minute Liters of Oxygen 2 L     6 Minute Oxygen Saturation % 94 %     6 Minute Liters of Oxygen 2 L     2 Minute Post Oxygen Saturation % 99 %     2 Minute Post Liters of Oxygen 2 L            Oxygen Initial Assessment:  Oxygen Initial Assessment - 02/03/21 1409      Initial 6 min Walk   Oxygen Used Continuous    Liters per minute 2      Program Oxygen Prescription   Program Oxygen Prescription Continuous    Liters per minute 2      Intervention   Short Term Goals To learn and exhibit compliance with exercise, home and travel O2 prescription;To learn and understand importance of monitoring SPO2 with pulse oximeter and demonstrate accurate use of the pulse oximeter.;To learn and understand importance of maintaining oxygen saturations>88%;To learn and demonstrate proper pursed lip breathing techniques or other breathing techniques.;To learn and demonstrate proper use of respiratory medications    Long  Term Goals Exhibits compliance with exercise, home and travel O2 prescription;Verbalizes importance of monitoring SPO2 with pulse oximeter and return demonstration;Maintenance of O2 saturations>88%;Compliance with respiratory medication;Exhibits proper breathing techniques, such as pursed lip breathing or other method taught during program session           Oxygen Re-Evaluation:   Oxygen Discharge (Final Oxygen Re-Evaluation):   Initial Exercise Prescription:  Initial Exercise Prescription - 02/03/21 1400      Date of Initial Exercise RX and Referring Provider   Date 02/03/21    Referring Provider Dr. Blythe Stanford    Expected Discharge Date 06/07/21       Oxygen   Liters 2      Treadmill   MPH 2.3    Grade 0    Minutes 17      Recumbant Elliptical   Level 1    RPM 50    Minutes 22      Prescription Details   Frequency (times per week) 2    Duration Progress to 30 minutes of continuous aerobic without signs/symptoms of physical distress      Intensity   THRR 40-80% of Max Heartrate 65-131    Ratings of Perceived Exertion 11-13    Perceived Dyspnea 0-4      Progression   Progression Continue to progress workloads to maintain intensity without signs/symptoms of physical distress.      Resistance Training   Training Prescription Yes    Weight 3    Reps 10-15           Perform Capillary Blood Glucose checks as needed.  Exercise Prescription Changes:   Exercise Comments:   Exercise Goals and Review:  Exercise Goals    Row Name 02/03/21 1430             Exercise Goals   Increase Physical Activity Yes       Intervention Provide advice, education, support and counseling about physical activity/exercise needs.;Develop an individualized exercise prescription for aerobic and resistive training based on initial evaluation findings, risk stratification, comorbidities and participant's personal goals.       Expected Outcomes Short Term: Attend rehab on a regular basis to increase amount of physical activity.;Long Term: Add in home exercise to make exercise part of routine and to increase amount of physical activity.;Long Term: Exercising regularly at least 3-5 days a week.       Increase Strength and Stamina Yes       Intervention Provide advice, education, support and counseling about physical activity/exercise needs.;Develop an individualized exercise prescription for aerobic and resistive training based on initial evaluation findings, risk stratification, comorbidities and participant's personal goals.       Expected Outcomes Short Term: Increase workloads from initial exercise prescription for resistance, speed, and  METs.;Short Term: Perform resistance training exercises routinely during rehab and add in resistance training at home;Long Term: Improve cardiorespiratory fitness, muscular endurance and strength as measured by increased METs and functional capacity (6MWT)       Able to understand and use rate of perceived exertion (RPE) scale Yes       Intervention Provide education and explanation on how to use RPE scale       Expected Outcomes Short Term: Able to use RPE daily in rehab to express subjective intensity level;Long Term:  Able to use RPE to guide intensity level when exercising independently       Able to understand and use Dyspnea scale Yes       Intervention Provide education and explanation on how to use Dyspnea scale       Expected Outcomes Short Term: Able to use Dyspnea scale daily in rehab to express subjective sense of shortness of breath during exertion;Long Term: Able to use Dyspnea scale to guide intensity level when exercising independently       Knowledge and understanding of Target Heart Rate Range (THRR) Yes       Intervention Provide education and explanation of THRR including how the numbers were predicted and where they are located for reference       Expected Outcomes Short Term: Able to state/look up THRR;Long Term: Able to use THRR to govern intensity when exercising independently;Short Term: Able to use daily as guideline for intensity in rehab       Able to check pulse independently Yes       Intervention Review the importance of being able to check your own pulse for safety during independent exercise;Provide education and demonstration on how to check pulse in carotid and radial arteries.       Expected Outcomes Short Term: Able to explain why pulse checking is important during independent exercise;Long Term: Able to check pulse independently and accurately       Understanding of Exercise Prescription Yes       Intervention Provide education, explanation, and written materials  on patient's individual exercise prescription       Expected Outcomes Short Term: Able to explain program exercise prescription;Long Term: Able to explain home exercise prescription to exercise independently              Exercise Goals Re-Evaluation :   Discharge Exercise Prescription (Final Exercise Prescription Changes):  Nutrition:  Target Goals: Understanding of nutrition guidelines, daily intake of sodium <156m, cholesterol <2031m calories 30% from fat and 7% or less from saturated fats, daily to have 5 or more servings of fruits and vegetables.  Biometrics:  Pre Biometrics - 02/03/21 1431      Pre Biometrics   Height _0  (1.727 m)    Weight 223 lb 1.7 oz (101.2 kg)    Waist Circumference 41 inches    Hip Circumference 40.5 inches    Waist to Hip Ratio 1.01 %    BMI (Calculated) 33.93    Triceps Skinfold 7 mm    % Body Fat 26.9 %    Grip Strength 28.7 kg    Flexibility 18 in    Single Leg Stand 42 seconds            Nutrition Therapy Plan and Nutrition Goals:   Nutrition Assessments:  MEDIFICTS Score Key:  ?70 Need to make dietary changes   40-70 Heart Healthy Diet  ? 40 Therapeutic Level Cholesterol Diet   Picture Your Plate Scores:  <4<98nhealthy dietary pattern with much room for improvement.  41-50 Dietary pattern unlikely to meet recommendations for good health and room for improvement.  51-60 More healthful dietary pattern, with some room for improvement.   >60 Healthy dietary pattern, although there may be some specific behaviors that could be improved.    Nutrition Goals Re-Evaluation:   Nutrition Goals Discharge (Final Nutrition Goals Re-Evaluation):   Psychosocial: Target Goals: Acknowledge presence or absence of significant depression and/or stress, maximize coping skills, provide positive support system. Participant is able to verbalize types and ability to use techniques and skills needed for reducing stress and  depression.  Initial Review & Psychosocial Screening:  Initial Psych Review & Screening - 02/03/21 1259      Initial Review   Current issues with None Identified      Family Dynamics   Good Support System? Yes    Comments His wife is his support system. His brother in law and sister in law support him. His step children support him as well.      Barriers   Psychosocial barriers to participate in program There are no identifiable barriers or psychosocial needs.      Screening Interventions   Interventions Encouraged to exercise    Expected Outcomes Long Term goal: The participant improves quality of Life and PHQ9 Scores as seen by post scores and/or verbalization of changes           Quality of Life Scores:  Quality of Life - 02/03/21 1415      Quality of Life   Select Quality of Life      Quality of Life Scores   Health/Function Pre 16.13 %    Socioeconomic Pre 24 %    Psych/Spiritual Pre 24 %    Family Pre 27.6 %    GLOBAL Pre 21 %          Scores of 19 and below usually indicate a poorer quality of life in these areas.  A difference of  2-3 points is a clinically meaningful difference.  A difference of 2-3 points in the total score of the Quality of Life Index has been associated with significant improvement in overall quality of life, self-image, physical symptoms, and general health in studies assessing change in quality of life.   PHQ-9: Recent Review Flowsheet Data    [facility]   Decreased Interest 1  Down, Depressed, Hopeless 0   PHQ - 2 Score 1   Altered sleeping 0   Tired, decreased energy 1   Change in appetite 1   Feeling bad or failure about yourself  0   Trouble concentrating 1   Moving slowly or fidgety/restless 2   Suicidal thoughts 0   PHQ-9 Score 6   Difficult doing work/chores Somewhat difficult     Interpretation of Total Score  Total Score Depression Severity:  1-4 = Minimal depression, 5-9 = Mild depression,  10-14 = Moderate depression, 15-19 = Moderately severe depression, 20-27 = Severe depression   Psychosocial Evaluation and Intervention:  Psychosocial Evaluation - 02/03/21 1404      Psychosocial Evaluation & Interventions   Interventions Encouraged to exercise with the program and follow exercise prescription    Comments Pt currently has no identifiable issues to participating in rehab. However, his work schedule could interfere if he is able to return to work. His previous work schedule was 6 a.m.-2 p.m., which would accomodate doing the program, but he is unsure what his new assignment will be at his job. Dr. Blythe Stanford has limited him to lifting only 5 lbs and he must wear oxygen when exerting himselft. 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 He has no identifiable psychosocial issues. His PHQ-9 score was a 6, and he attributes his problems to his lack of energy since being in the hospitial with COVID-19. He has a good support system at home with his wife, step kids, brother in law, and sister in Sports coach. His goals are to decrease his SOB with activities and to eventually wean off of oxygen. He is eager to start the program.    Expected Outcomes The patient will continue to not have any identifiable psychosocial issues.    Continue Psychosocial Services  No Follow up required           Psychosocial Re-Evaluation:   Psychosocial Discharge (Final Psychosocial Re-Evaluation):    Education: Education Goals: Education classes will be provided on a weekly basis, covering required topics. Participant will state understanding/return demonstration of topics presented.  Learning Barriers/Preferences:  Learning Barriers/Preferences - 02/03/21 1304      Learning Barriers/Preferences   Learning Barriers None    Learning Preferences Skilled Demonstration;Verbal Instruction           Education Topics: How Lungs Work and Diseases: - Discuss the  anatomy of the lungs and diseases that can affect the lungs, such as COPD.   Exercise: -Discuss the importance of exercise, FITT principles of exercise, normal and abnormal responses to exercise, and how to exercise safely.   Environmental Irritants: -Discuss types of environmental irritants and how to limit exposure to environmental irritants.   Meds/Inhalers and oxygen: - Discuss respiratory medications, definition of an inhaler and oxygen, and the proper way to use an inhaler and oxygen.   Energy Saving Techniques: - Discuss methods to conserve energy and decrease shortness of breath when performing activities of daily living.    Bronchial Hygiene / Breathing Techniques: - Discuss breathing mechanics, pursed-lip breathing technique,  proper posture, effective ways to clear airways, and other functional breathing techniques   Cleaning Equipment: - Provides group verbal and written instruction about the health risks of elevated stress, cause of high stress, and healthy ways to reduce stress.   Nutrition I: Fats: - Discuss the types of cholesterol, what cholesterol does to the body, and how cholesterol  levels can be controlled.   Nutrition II: Labels: -Discuss the different components of food labels and how to read food labels.   Respiratory Infections: - Discuss the signs and symptoms of respiratory infections, ways to prevent respiratory infections, and the importance of seeking medical treatment when having a respiratory infection.   Stress I: Signs and Symptoms: - Discuss the causes of stress, how stress may lead to anxiety and depression, and ways to limit stress.   Stress II: Relaxation: -Discuss relaxation techniques to limit stress.   Oxygen for Home/Travel: - Discuss how to prepare for travel when on oxygen and proper ways to transport and store oxygen to ensure safety.   Knowledge Questionnaire Score:  Knowledge Questionnaire Score - 02/03/21 1305       Knowledge Questionnaire Score   Pre Score 13/18           Core Components/Risk Factors/Patient Goals at Admission:  Personal Goals and Risk Factors at Admission - 02/03/21 1307      Core Components/Risk Factors/Patient Goals on Admission    Weight Management Weight Maintenance;Yes    Intervention Weight Management: Develop a combined nutrition and exercise program designed to reach desired caloric intake, while maintaining appropriate intake of nutrient and fiber, sodium and fats, and appropriate energy expenditure required for the weight goal.;Weight Management: Provide education and appropriate resources to help participant work on and attain dietary goals.;Weight Management/Obesity: Establish reasonable short term and long term weight goals.    Expected Outcomes Short Term: Continue to assess and modify interventions until short term weight is achieved;Long Term: Adherence to nutrition and physical activity/exercise program aimed toward attainment of established weight goal;Weight Maintenance: Understanding of the daily nutrition guidelines, which includes 25-35% calories from fat, 7% or less cal from saturated fats, less than 222m cholesterol, less than 1.5gm of sodium, & 5 or more servings of fruits and vegetables daily    Improve shortness of breath with ADL's Yes    Intervention Provide education, individualized exercise plan and daily activity instruction to help decrease symptoms of SOB with activities of daily living.    Expected Outcomes Short Term: Improve cardiorespiratory fitness to achieve a reduction of symptoms when performing ADLs;Long Term: Be able to perform more ADLs without symptoms or delay the onset of symptoms    Diabetes Yes    Intervention Provide education about signs/symptoms and action to take for hypo/hyperglycemia.;Provide education about proper nutrition, including hydration, and aerobic/resistive exercise prescription along with prescribed medications to achieve  blood glucose in normal ranges: Fasting glucose 65-99 mg/dL    Expected Outcomes Long Term: Attainment of HbA1C < 7%.;Short Term: Participant verbalizes understanding of the signs/symptoms and immediate care of hyper/hypoglycemia, proper foot care and importance of medication, aerobic/resistive exercise and nutrition plan for blood glucose control.           Core Components/Risk Factors/Patient Goals Review:    Core Components/Risk Factors/Patient Goals at Discharge (Final Review):    ITP Comments:   Comments: Patient arrived for 1st visit/orientation/education at 1230. Patient was referred to PR by Dr. SBlythe Stanforddue to ILD (J84.9). During orientation advised patient on arrival and appointment times what to wear, what to do before, during and after exercise. Reviewed attendance and class policy.  Pt is scheduled to return Pulmonary Rehab on 02/08/2021 at 1500. Pt was advised to come to class 15 minutes before class starts.  Discussed RPE/Dpysnea scales. Patient participated in warm up stretches. Patient was able to complete 6 minute walk test. Patient was measured  for the equipment. Discussed equipment safety with patient. Took patient pre-anthropometric measurements. Patient finished visit at 1415.

## 2021-02-08 ENCOUNTER — Encounter (HOSPITAL_COMMUNITY)
Admission: RE | Admit: 2021-02-08 | Discharge: 2021-02-08 | Disposition: A | Discharge status: Home / Self Care | Payer: BC Managed Care – PPO | Source: Ambulatory Visit | Attending: Pulmonary Disease | Admitting: Pulmonary Disease

## 2021-02-08 ENCOUNTER — Other Ambulatory Visit: Payer: Self-pay

## 2021-02-08 DIAGNOSIS — J849 Interstitial pulmonary disease, unspecified: Secondary | ICD-10-CM

## 2021-02-08 NOTE — Progress Notes (Signed)
Incomplete Session Note  Patient Details  Name: Gregory Johnston MRN: [id number] Date of Birth: Jun 01, 1966 Referring Provider:   Flowsheet Row PULMONARY REHAB OTHER RESP ORIENTATION from 02/03/2021 in [facility] CARDIAC REHABILITATION  Referring Provider Dr. [name] did not complete his rehab session.  His CBG was 304 upon arrival. Rechecked 15 minutes later and was 326. Departmental guidelines do not allow exercise with CBG >300. Pt reported that he ate a sweet potato with butter and two hot dogs on one bun about an hour before coming in. We reviewed healthier lunch options in order to maintain adequate blood sugars that will allow him to exercise.

## 2021-02-10 ENCOUNTER — Encounter (HOSPITAL_COMMUNITY)
Admission: RE | Admit: 2021-02-10 | Discharge: 2021-02-10 | Disposition: A | Discharge status: Home / Self Care | Payer: BC Managed Care – PPO | Source: Ambulatory Visit | Attending: Pulmonary Disease | Admitting: Pulmonary Disease

## 2021-02-10 ENCOUNTER — Other Ambulatory Visit: Payer: Self-pay

## 2021-02-10 DIAGNOSIS — J849 Interstitial pulmonary disease, unspecified: Secondary | ICD-10-CM

## 2021-02-10 LAB — GLUCOSE, CAPILLARY: Glucose-Capillary: 169 mg/dL — ABNORMAL HIGH (ref 70–99)

## 2021-02-10 NOTE — Progress Notes (Addendum)
Daily Session Note  Patient Details  Name: Gregory Johnston MRN: [id number] Date of Birth: 02-10-1966 Referring Provider:   Flowsheet Row PULMONARY REHAB OTHER RESP ORIENTATION from 02/03/2021 in [place]  Referring Provider Dr. Blythe Stanford      Encounter Date: 02/10/2021  Check In:  Session Check In - 02/10/21 1500      Check-In   Supervising physician immediately available to respond to emergencies [facility] MD immediately available    Physician(s) Dr. Domenic Polite    Location [facility]    Staff Present Aundra Dubin, RN, BSN;Phyllis Billingsley, RN    Virtual Visit No    Medication changes reported     No    Fall or balance concerns reported    No    Tobacco Cessation No Change    Warm-up and Cool-down Performed as group-led instruction    Resistance Training Performed Yes    VAD Patient? No    PAD/SET Patient? No      Pain Assessment   Currently in Pain? No/denies    Multiple Pain Sites No           Capillary Blood Glucose: Results for orders placed or performed during the hospital encounter of 02/10/21 (from the past 24 hour(s))  Glucose, capillary     Status: Abnormal   Collection Time: 02/10/21  2:48 PM  Result Value Ref Range   Glucose-Capillary 169 (H) 70 - 99 mg/dL      Social History   Tobacco Use  Smoking Status Former Smoker  Smokeless Tobacco Never Used    Goals Met:  Proper associated with RPD/PD & O2 Sat Independence with exercise equipment Improved SOB with ADL's Using PLB without cueing & demonstrates good technique Exercise tolerated well No report of cardiac concerns or symptoms Strength training completed today  Goals Unmet:  Not Applicable  Comments: Check out 1600.   Dr. Kathie Dike is Medical Director for [facility] Pul[facility] Re[facility].

## 2021-02-15 ENCOUNTER — Encounter (HOSPITAL_COMMUNITY): Payer: BC Managed Care – PPO

## 2021-02-17 ENCOUNTER — Other Ambulatory Visit: Payer: Self-pay

## 2021-02-17 ENCOUNTER — Encounter (HOSPITAL_COMMUNITY)
Admission: RE | Admit: 2021-02-17 | Discharge: 2021-02-17 | Disposition: A | Discharge status: Home / Self Care | Payer: BC Managed Care – PPO | Source: Ambulatory Visit | Attending: Pulmonary Disease | Admitting: Pulmonary Disease

## 2021-02-17 DIAGNOSIS — J849 Interstitial pulmonary disease, unspecified: Secondary | ICD-10-CM | POA: Diagnosis not present

## 2021-02-17 NOTE — Progress Notes (Signed)
Daily Session Note  Patient Details  Name: Gregory Johnston MRN: [id number] Date of Birth: Feb 29, 1966 Referring Provider:   Flowsheet Row PULMONARY REHAB OTHER RESP ORIENTATION from 02/03/2021 in [place]  Referring Provider Dr. Blythe Stanford      Encounter Date: 02/17/2021  Check In:  Session Check In - 02/17/21 1453      Check-In   Supervising physician immediately available to respond to emergencies [facility] MD immediately available    Physician(s) Dr. Harl Bowie    Location [facility]    Staff Present Aundra Dubin, RN, BSN;Madison Audria Nine, MS, Exercise Physiologist    Virtual Visit No    Medication changes reported     No    Fall or balance concerns reported    No    Tobacco Cessation No Change    Warm-up and Cool-down Performed as group-led instruction    Resistance Training Performed Yes    VAD Patient? No    PAD/SET Patient? No      Pain Assessment   Currently in Pain? No/denies    Multiple Pain Sites No           Capillary Blood Glucose: No results found for this or any previous visit (from the past 24 hour(s)).    Social History   Tobacco Use  Smoking Status Former Smoker  Smokeless Tobacco Never Used    Goals Met:  Proper associated with RPD/PD & O2 Sat Independence with exercise equipment Improved SOB with ADL's Using PLB without cueing & demonstrates good technique Exercise tolerated well No report of cardiac concerns or symptoms Strength training completed today  Goals Unmet:  Not Applicable  Comments: Check out 1600.   Dr. Kathie Dike is Medical Director for [facility] Pul[facility] Re[facility].

## 2021-02-22 ENCOUNTER — Other Ambulatory Visit: Payer: Self-pay

## 2021-02-22 ENCOUNTER — Encounter (HOSPITAL_COMMUNITY)
Admission: RE | Admit: 2021-02-22 | Discharge: 2021-02-22 | Disposition: A | Discharge status: Home / Self Care | Payer: BC Managed Care – PPO | Source: Ambulatory Visit | Attending: Pulmonary Disease | Admitting: Pulmonary Disease

## 2021-02-22 DIAGNOSIS — J849 Interstitial pulmonary disease, unspecified: Secondary | ICD-10-CM | POA: Diagnosis not present

## 2021-02-22 NOTE — Progress Notes (Signed)
Daily Session Note  Patient Details  Name: Gregory Johnston MRN: [id number] Date of Birth: [birth year] Referring Provider:   Flowsheet Row PULMONARY REHAB OTHER RESP ORIENTATION from 02/03/2021 in [place]  Referring Provider Dr. Blythe Stanford      Encounter Date: 02/22/2021  Check In:  Session Check In - 02/22/21 1500      Check-In   Supervising physician immediately available to respond to emergencies [facility] MD immediately available    Physician(s) Dr.. Domenic Polite    Location [facility]    Staff Present Geanie Cooley, RN;Madison Audria Nine, MS, Exercise Physiologist    Virtual Visit No    Medication changes reported     No    Fall or balance concerns reported    No    Tobacco Cessation No Change    Warm-up and Cool-down Performed as group-led instruction    Resistance Training Performed Yes    VAD Patient? No    PAD/SET Patient? No      Pain Assessment   Currently in Pain? No/denies    Multiple Pain Sites No           Capillary Blood Glucose: No results found for this or any previous visit (from the past 24 hour(s)).    Social History   Tobacco Use  Smoking Status Former Smoker  Smokeless Tobacco Never Used    Goals Met:  Proper associated with RPD/PD & O2 Sat Independence with exercise equipment Using PLB without cueing & demonstrates good technique Exercise tolerated well No report of cardiac concerns or symptoms Strength training completed today  Goals Unmet:  Not Applicable  Comments: check out @ 4:00   Dr. Kathie Dike is Medical Director for [facility] Pulmonary Rehab.

## 2021-02-23 ENCOUNTER — Ambulatory Visit: Payer: BC Managed Care – PPO | Admitting: Podiatry

## 2021-02-23 ENCOUNTER — Encounter: Payer: Self-pay | Admitting: Podiatry

## 2021-02-23 DIAGNOSIS — L603 Nail dystrophy: Secondary | ICD-10-CM

## 2021-02-23 DIAGNOSIS — Z79899 Other long term (current) drug therapy: Secondary | ICD-10-CM | POA: Diagnosis not present

## 2021-02-23 MED ORDER — TERBINAFINE HCL 250 MG PO TABS: 250.0000 mg | ORAL_TABLET | Freq: Every day | ORAL | 0 refills | Status: DC

## 2021-02-23 NOTE — Patient Instructions (Signed)
Terbinafine tablets What is this medicine? TERBINAFINE (TER bin a feen) is an antifungal medicine. It is used to treat certain kinds of fungal or yeast infections. This medicine may be used for other purposes; ask your health care provider or pharmacist if you have questions. COMMON BRAND NAME(S): Lamisil, Terbinex What should I tell my health care provider before I take this medicine? They need to know if you have any of these conditions:  drink alcoholic beverages  kidney disease  liver disease  an unusual or allergic reaction to terbinafine, other medicines, foods, dyes, or preservatives  pregnant or trying to get pregnant  breast-feeding How should I use this medicine? Take this medicine by mouth with a full glass of water. Follow the directions on the prescription label. You can take this medicine with food or on an empty stomach. Take your medicine at regular intervals. Do not take your medicine more often than directed. Do not skip doses or stop your medicine early even if you feel better. Do not stop taking except on your doctor's advice. A special MedGuide will be given to you by the pharmacist with each prescription and refill. Be sure to read this information carefully each time. Talk to your pediatrician regarding the use of this medicine in children. Special care may be needed. Overdosage: If you think you have taken too much of this medicine contact a poison control center or emergency room at once. NOTE: This medicine is only for you. Do not share this medicine with others. What if I miss a dose? If you miss a dose, take it as soon as you can. If it is almost time for your next dose, take only that dose. Do not take double or extra doses. What may interact with this medicine? Do not take this medicine with any of the following medications:  thioridazine This medicine may also interact with the following  medications:  beta-blockers  caffeine  cimetidine  cyclosporine  medicines for depression, anxiety, or psychotic disturbances  medicines for fungal infections like fluconazole and ketoconazole  medicines for irregular heartbeat like amiodarone, flecainide and propafenone  rifampin  warfarin This list may not describe all possible interactions. Give your health care provider a list of all the medicines, herbs, non-prescription drugs, or dietary supplements you use. Also tell them if you smoke, drink alcohol, or use illegal drugs. Some items may interact with your medicine. What should I watch for while using this medicine? Visit your doctor or health care provider regularly. Tell your doctor right away if you have nausea or vomiting, loss of appetite, stomach pain on your right upper side, yellow skin, dark urine, light stools, or are over tired. Some fungal infections need many weeks or months of treatment to cure. If you are taking this medicine for a long time, you will need to have important blood work done. This medicine may cause serious skin reactions. They can happen weeks to months after starting the medicine. Contact your health care provider right away if you notice fevers or flu-like symptoms with a rash. The rash may be red or purple and then turn into blisters or peeling of the skin. Or, you might notice a red rash with swelling of the face, lips or lymph nodes in your neck or under your arms. What side effects may I notice from receiving this medicine? Side effects that you should report to your doctor or health care professional as soon as possible:  allergic reactions like skin rash or hives,   swelling of the face, lips, or tongue  changes in vision  dark urine  fever or infection  general ill feeling or flu-like symptoms  light-colored stools  loss of appetite, nausea  rash, fever, and swollen lymph nodes  redness, blistering, peeling or loosening of the  skin, including inside the mouth  right upper belly pain  unusually weak or tired  yellowing of the eyes or skin Side effects that usually do not require medical attention (report to your doctor or health care professional if they continue or are bothersome):  changes in taste  diarrhea  hair loss  muscle or joint pain  stomach gas  stomach upset This list may not describe all possible side effects. Call your doctor for medical advice about side effects. You may report side effects to FDA at 1-800-FDA-1088. Where should I keep my medicine? Keep out of the reach of children. Store at room temperature below 25 degrees C (77 degrees F). Protect from light. Throw away any unused medicine after the expiration date. NOTE: This sheet is a summary. It may not cover all possible information. If you have questions about this medicine, talk to your doctor, pharmacist, or health care provider.  2021 Elsevier/Gold Standard (2018-12-27 15:37:07)  

## 2021-02-23 NOTE — Progress Notes (Signed)
He presents today for follow-up of his pathology report regarding his toenail.  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  Objective: Vital signs are stable he is alert and oriented x3.  Pathology report does demonstrate Trichophyton rubrum as the primary infectious organism.  Assessment: Onychomycosis.  Plan: We discussed the pros and cons of topical therapy laser therapy and oral therapy at this point he would like to wait until he is feeling better before doing any of these treatments this is perfectly understandable and I will follow-up with him on an as-needed basis.

## 2021-02-23 NOTE — Progress Notes (Signed)
Discharge Progress Report  Patient Details  Name: Gregory Johnston MRN: [id number] Date of Birth: 1966/01/06 Referring Provider:   Flowsheet Row PULMONARY REHAB OTHER RESP ORIENTATION from 02/03/2021 in [facility]  Referring Provider Dr. Melodye Ped       Number of Visits: 5  Reason for Discharge:  Early Exit:  Insurance  Smoking History:  Social History   Tobacco Use  Smoking Status Former Smoker  Smokeless Tobacco Never Used    Diagnosis:  Interstitial lung disease (HCC)  ADL UCSD:  Pulmonary Assessment Scores    Row Name 02/03/21 1256         ADL UCSD   SOB Score total 70           CAT Score   CAT Score 21           mMRC Score   mMRC Score 1            Initial Exercise Prescription:  Initial Exercise Prescription - 02/03/21 1400      Date of Initial Exercise RX and Referring Provider   Date 02/03/21    Referring Provider Dr. Melodye Ped    Expected Discharge Date 06/07/21      Oxygen   Liters 2      Treadmill   MPH 2.3    Grade 0    Minutes 17      Recumbant Elliptical   Level 1    RPM 50    Minutes 22      Prescription Details   Frequency (times per week) 2    Duration Progress to 30 minutes of continuous aerobic without signs/symptoms of physical distress      Intensity   THRR 40-80% of Max Heartrate 65-131    Ratings of Perceived Exertion 11-13    Perceived Dyspnea 0-4      Progression   Progression Continue to progress workloads to maintain intensity without signs/symptoms of physical distress.      Resistance Training   Training Prescription Yes    Weight 3    Reps 10-15           Discharge Exercise Prescription (Final Exercise Prescription Changes):  Exercise Prescription Changes - 02/10/21 1510      Response to Exercise   Blood Pressure (Admit) 158/80    Blood Pressure (Exercise) 160/80    Blood Pressure (Exit) 130/78    Heart Rate (Admit) 90 bpm    Heart Rate (Exercise) 103 bpm    Heart Rate (Exit) 91  bpm    Oxygen Saturation (Admit) 99 %    Oxygen Saturation (Exercise) 94 %    Oxygen Saturation (Exit) 98 %    Rating of Perceived Exertion (Exercise) 12    Perceived Dyspnea (Exercise) 12    Duration Continue with 30 min of aerobic exercise without signs/symptoms of physical distress.    Intensity THRR unchanged      Progression   Progression Continue to progress workloads to maintain intensity without signs/symptoms of physical distress.      Resistance Training   Training Prescription Yes    Weight 3 lbs    Reps 10-15    Time 10 Minutes      Oxygen   Oxygen Continuous    Liters 2      Treadmill   MPH 1.7    Grade 0    Minutes 17    METs 2.3      Recumbant Elliptical   Level 1    RPM  52    Minutes 22    METs 4           Functional Capacity:  6 Minute Walk    Row Name 02/03/21 1426         6 Minute Walk   Phase Initial     Distance 1200 feet     Walk Time 6 minutes     # of Rest Breaks 0     MPH 2.3     METS 3.25     RPE 12     Perceived Dyspnea  12     VO2 Peak 11.37     Symptoms No     Resting HR 82 bpm     Resting BP 122/74     Resting Oxygen Saturation  97 %     Exercise Oxygen Saturation  during 6 min walk 93 %     Max Ex. HR 102 bpm     Max Ex. BP 152/76     2 Minute Post BP 148/78           Interval HR   1 Minute HR 93     2 Minute HR 97     3 Minute HR 98     4 Minute HR 98     5 Minute HR 100     6 Minute HR 102     2 Minute Post HR 83     Interval Heart Rate? Yes           Interval Oxygen   Interval Oxygen? Yes     Baseline Oxygen Saturation % 97 %     1 Minute Oxygen Saturation % 97 %     1 Minute Liters of Oxygen 2 L     2 Minute Oxygen Saturation % 94 %     2 Minute Liters of Oxygen 2 L     3 Minute Oxygen Saturation % 93 %     3 Minute Liters of Oxygen 2 L     4 Minute Oxygen Saturation % 94 %     4 Minute Liters of Oxygen 2 L     5 Minute Oxygen Saturation % 94 %     5 Minute Liters of Oxygen 2 L     6 Minute  Oxygen Saturation % 94 %     6 Minute Liters of Oxygen 2 L     2 Minute Post Oxygen Saturation % 99 %     2 Minute Post Liters of Oxygen 2 L            Psychological, QOL, Others - Outcomes: PHQ 2/9: Depression screen PHQ 2/9 02/03/2021  Decreased Interest 1  Down, Depressed, Hopeless 0  PHQ - 2 Score 1  Altered sleeping 0  Tired, decreased energy 1  Change in appetite 1  Feeling bad or failure about yourself  0  Trouble concentrating 1  Moving slowly or fidgety/restless 2  Suicidal thoughts 0  PHQ-9 Score 6  Difficult doing work/chores Somewhat difficult    Quality of Life:  Quality of Life - 02/03/21 1415      Quality of Life   Select Quality of Life      Quality of Life Scores   Health/Function Pre 16.13 %    Socioeconomic Pre 24 %    Psych/Spiritual Pre 24 %    Family Pre 27.6 %    GLOBAL Pre 21 %  Personal Goals: Goals established at orientation with interventions provided to work toward goal.  Personal Goals and Risk Factors at Admission - 02/03/21 1307      Core Components/Risk Factors/Patient Goals on Admission    Weight Management Weight Maintenance;Yes    Intervention Weight Management: Develop a combined nutrition and exercise program designed to reach desired caloric intake, while maintaining appropriate intake of nutrient and fiber, sodium and fats, and appropriate energy expenditure required for the weight goal.;Weight Management: Provide education and appropriate resources to help participant work on and attain dietary goals.;Weight Management/Obesity: Establish reasonable short term and long term weight goals.    Expected Outcomes Short Term: Continue to assess and modify interventions until short term weight is achieved;Long Term: Adherence to nutrition and physical activity/exercise program aimed toward attainment of established weight goal;Weight Maintenance: Understanding of the daily nutrition guidelines, which includes 25-35% calories  from fat, 7% or less cal from saturated fats, less than 200mg  cholesterol, less than 1.5gm of sodium, & 5 or more servings of fruits and vegetables daily    Improve shortness of breath with ADL's Yes    Intervention Provide education, individualized exercise plan and daily activity instruction to help decrease symptoms of SOB with activities of daily living.    Expected Outcomes Short Term: Improve cardiorespiratory fitness to achieve a reduction of symptoms when performing ADLs;Long Term: Be able to perform more ADLs without symptoms or delay the onset of symptoms    Diabetes Yes    Intervention Provide education about signs/symptoms and action to take for hypo/hyperglycemia.;Provide education about proper nutrition, including hydration, and aerobic/resistive exercise prescription along with prescribed medications to achieve blood glucose in normal ranges: Fasting glucose 65-99 mg/dL    Expected Outcomes Long Term: Attainment of HbA1C < 7%.;Short Term: Participant verbalizes understanding of the signs/symptoms and immediate care of hyper/hypoglycemia, proper foot care and importance of medication, aerobic/resistive exercise and nutrition plan for blood glucose control.            Personal Goals Discharge:   Exercise Goals and Review:  Exercise Goals    Row Name 02/03/21 1430             Exercise Goals   Increase Physical Activity Yes       Intervention Provide advice, education, support and counseling about physical activity/exercise needs.;Develop an individualized exercise prescription for aerobic and resistive training based on initial evaluation findings, risk stratification, comorbidities and participant's personal goals.       Expected Outcomes Short Term: Attend rehab on a regular basis to increase amount of physical activity.;Long Term: Add in home exercise to make exercise part of routine and to increase amount of physical activity.;Long Term: Exercising regularly at least 3-5  days a week.       Increase Strength and Stamina Yes       Intervention Provide advice, education, support and counseling about physical activity/exercise needs.;Develop an individualized exercise prescription for aerobic and resistive training based on initial evaluation findings, risk stratification, comorbidities and participant's personal goals.       Expected Outcomes Short Term: Increase workloads from initial exercise prescription for resistance, speed, and METs.;Short Term: Perform resistance training exercises routinely during rehab and add in resistance training at home;Long Term: Improve cardiorespiratory fitness, muscular endurance and strength as measured by increased METs and functional capacity (04/05/21)       Able to understand and use rate of perceived exertion (RPE) scale Yes       Intervention Provide education and  explanation on how to use RPE scale       Expected Outcomes Short Term: Able to use RPE daily in rehab to express subjective intensity level;Long Term:  Able to use RPE to guide intensity level when exercising independently       Able to understand and use Dyspnea scale Yes       Intervention Provide education and explanation on how to use Dyspnea scale       Expected Outcomes Short Term: Able to use Dyspnea scale daily in rehab to express subjective sense of shortness of breath during exertion;Long Term: Able to use Dyspnea scale to guide intensity level when exercising independently       Knowledge and understanding of Target Heart Rate Range (THRR) Yes       Intervention Provide education and explanation of THRR including how the numbers were predicted and where they are located for reference       Expected Outcomes Short Term: Able to state/look up THRR;Long Term: Able to use THRR to govern intensity when exercising independently;Short Term: Able to use daily as guideline for intensity in rehab       Able to check pulse independently Yes       Intervention Review the  importance of being able to check your own pulse for safety during independent exercise;Provide education and demonstration on how to check pulse in carotid and radial arteries.       Expected Outcomes Short Term: Able to explain why pulse checking is important during independent exercise;Long Term: Able to check pulse independently and accurately       Understanding of Exercise Prescription Yes       Intervention Provide education, explanation, and written materials on patient's individual exercise prescription       Expected Outcomes Short Term: Able to explain program exercise prescription;Long Term: Able to explain home exercise prescription to exercise independently              Exercise Goals Re-Evaluation:   Nutrition & Weight - Outcomes:  Pre Biometrics - 02/03/21 1431      Pre Biometrics   Height 5\' 8"  (1.727 m)    Weight 101.2 kg    Waist Circumference 41 inches    Hip Circumference 40.5 inches    Waist to Hip Ratio 1.01 %    BMI (Calculated) 33.93    Triceps Skinfold 7 mm    % Body Fat 26.9 %    Grip Strength 28.7 kg    Flexibility 18 in    Single Leg Stand 42 seconds            Nutrition:   Nutrition Discharge:   Education Questionnaire Score:  Knowledge Questionnaire Score - 02/03/21 1305      Knowledge Questionnaire Score   Pre Score 13/18          Patient is being discharged because his insurance runs out 02/23/21. He is going to try and get on his wife's insurance, but is unsure when that will be.

## 2021-02-24 ENCOUNTER — Encounter (HOSPITAL_COMMUNITY): Payer: BC Managed Care – PPO

## 2021-03-01 ENCOUNTER — Encounter (HOSPITAL_COMMUNITY): Payer: BC Managed Care – PPO

## 2021-03-03 ENCOUNTER — Encounter (HOSPITAL_COMMUNITY): Payer: BC Managed Care – PPO

## 2021-03-08 ENCOUNTER — Encounter (HOSPITAL_COMMUNITY): Payer: BC Managed Care – PPO

## 2021-03-10 ENCOUNTER — Encounter (HOSPITAL_COMMUNITY): Payer: BC Managed Care – PPO

## 2021-03-15 ENCOUNTER — Encounter (HOSPITAL_COMMUNITY): Payer: BC Managed Care – PPO

## 2021-03-17 ENCOUNTER — Encounter (HOSPITAL_COMMUNITY): Payer: BC Managed Care – PPO

## 2021-03-22 ENCOUNTER — Encounter (HOSPITAL_COMMUNITY): Payer: BC Managed Care – PPO

## 2021-03-24 ENCOUNTER — Encounter (HOSPITAL_COMMUNITY): Payer: BC Managed Care – PPO

## 2021-03-29 ENCOUNTER — Encounter (HOSPITAL_COMMUNITY): Payer: BC Managed Care – PPO

## 2021-03-31 ENCOUNTER — Encounter (HOSPITAL_COMMUNITY): Payer: BC Managed Care – PPO

## 2021-04-05 ENCOUNTER — Encounter (HOSPITAL_COMMUNITY): Payer: BC Managed Care – PPO

## 2021-04-07 ENCOUNTER — Encounter (HOSPITAL_COMMUNITY): Payer: BC Managed Care – PPO

## 2021-04-12 ENCOUNTER — Encounter (HOSPITAL_COMMUNITY): Payer: BC Managed Care – PPO

## 2021-04-14 ENCOUNTER — Encounter (HOSPITAL_COMMUNITY): Payer: BC Managed Care – PPO

## 2021-04-19 ENCOUNTER — Encounter (HOSPITAL_COMMUNITY): Payer: BC Managed Care – PPO

## 2021-04-21 ENCOUNTER — Encounter (HOSPITAL_COMMUNITY): Payer: BC Managed Care – PPO

## 2021-04-26 ENCOUNTER — Encounter (HOSPITAL_COMMUNITY): Payer: BC Managed Care – PPO

## 2021-04-28 ENCOUNTER — Encounter (HOSPITAL_COMMUNITY): Payer: BC Managed Care – PPO

## 2021-05-03 ENCOUNTER — Encounter (HOSPITAL_COMMUNITY): Payer: BC Managed Care – PPO

## 2021-05-05 ENCOUNTER — Encounter (HOSPITAL_COMMUNITY): Payer: BC Managed Care – PPO

## 2021-05-10 ENCOUNTER — Encounter (HOSPITAL_COMMUNITY): Payer: BC Managed Care – PPO

## 2021-05-12 ENCOUNTER — Encounter (HOSPITAL_COMMUNITY): Payer: BC Managed Care – PPO

## 2021-05-17 ENCOUNTER — Encounter (HOSPITAL_COMMUNITY): Payer: BC Managed Care – PPO

## 2021-05-19 ENCOUNTER — Encounter (HOSPITAL_COMMUNITY): Payer: BC Managed Care – PPO

## 2021-05-24 ENCOUNTER — Encounter (HOSPITAL_COMMUNITY): Payer: BC Managed Care – PPO

## 2021-05-26 ENCOUNTER — Encounter (HOSPITAL_COMMUNITY): Payer: BC Managed Care – PPO

## 2021-05-31 ENCOUNTER — Encounter (HOSPITAL_COMMUNITY): Payer: BC Managed Care – PPO

## 2021-06-02 ENCOUNTER — Encounter (HOSPITAL_COMMUNITY): Payer: BC Managed Care – PPO

## 2021-06-07 ENCOUNTER — Encounter (HOSPITAL_COMMUNITY): Payer: BC Managed Care – PPO

## 2021-06-09 ENCOUNTER — Encounter (HOSPITAL_COMMUNITY): Payer: BC Managed Care – PPO

## 2024-01-08 ENCOUNTER — Other Ambulatory Visit: Payer: Self-pay | Admitting: Pulmonary Disease

## 2024-01-08 DIAGNOSIS — F458 Other somatoform disorders: Secondary | ICD-10-CM

## 2024-01-08 DIAGNOSIS — J849 Interstitial pulmonary disease, unspecified: Secondary | ICD-10-CM

## 2024-01-17 ENCOUNTER — Ambulatory Visit
Admission: RE | Admit: 2024-01-17 | Discharge: 2024-01-17 | Disposition: A | Discharge status: Home / Self Care | Source: Ambulatory Visit | Attending: Pulmonary Disease | Admitting: Pulmonary Disease

## 2024-01-17 DIAGNOSIS — F458 Other somatoform disorders: Secondary | ICD-10-CM

## 2024-01-17 DIAGNOSIS — J849 Interstitial pulmonary disease, unspecified: Secondary | ICD-10-CM | POA: Diagnosis present

## 2024-04-18 ENCOUNTER — Encounter
Admission: RE | Disposition: A | Discharge status: Home / Self Care | Payer: Self-pay | Source: Ambulatory Visit | Attending: Gastroenterology

## 2024-04-18 ENCOUNTER — Encounter: Payer: Self-pay | Admitting: Anesthesiology

## 2024-04-18 ENCOUNTER — Ambulatory Visit
Admission: RE | Admit: 2024-04-18 | Discharge: 2024-04-18 | Disposition: A | Discharge status: Home / Self Care | Source: Ambulatory Visit | Attending: Gastroenterology | Admitting: Gastroenterology

## 2024-04-18 DIAGNOSIS — K219 Gastro-esophageal reflux disease without esophagitis: Secondary | ICD-10-CM | POA: Insufficient documentation

## 2024-04-18 DIAGNOSIS — E119 Type 2 diabetes mellitus without complications: Secondary | ICD-10-CM | POA: Diagnosis not present

## 2024-04-18 DIAGNOSIS — Z539 Procedure and treatment not carried out, unspecified reason: Secondary | ICD-10-CM | POA: Insufficient documentation

## 2024-04-18 DIAGNOSIS — Z860100 Personal history of colon polyps, unspecified: Secondary | ICD-10-CM | POA: Diagnosis not present

## 2024-04-18 DIAGNOSIS — Z7985 Long-term (current) use of injectable non-insulin antidiabetic drugs: Secondary | ICD-10-CM | POA: Insufficient documentation

## 2024-04-18 HISTORY — PX: COLONOSCOPY: SHX5424

## 2024-04-18 HISTORY — PX: ESOPHAGOGASTRODUODENOSCOPY: SHX5428

## 2024-04-18 SURGERY — COLONOSCOPY
Anesthesia: General

## 2024-04-18 MED ORDER — SODIUM CHLORIDE 0.9 % IV SOLN: INTRAVENOUS | Status: DC

## 2024-04-18 NOTE — H&P (Signed)
 Although patient completed Suprep, he reports that he is still having some brown stools which would not lead to adequate exam.  Will reprep him and perform his bidirectional endoscopy at a later date.  Patient verbalized understanding and is in agreement.  [name], DO [facility]

## 2024-04-18 NOTE — OR Nursing (Signed)
 Pt arrived to unit reporting that 1st dose of suprep did not work and that he took 2 suppositories  with no results. Pt had bm water with loose soft stool. Reports he has hx of constipation and is taking ozempic. Pt with be re-scheduled per dr onita

## 2024-04-20 ENCOUNTER — Encounter: Payer: Self-pay | Admitting: Gastroenterology

## 2024-04-25 ENCOUNTER — Other Ambulatory Visit: Payer: Self-pay

## 2024-04-25 ENCOUNTER — Ambulatory Visit: Admitting: Anesthesiology

## 2024-04-25 ENCOUNTER — Encounter
Admission: RE | Disposition: A | Discharge status: Home / Self Care | Payer: Self-pay | Source: Home / Self Care | Attending: Gastroenterology

## 2024-04-25 ENCOUNTER — Ambulatory Visit
Admission: RE | Admit: 2024-04-25 | Discharge: 2024-04-25 | Disposition: A | Discharge status: Home / Self Care | Attending: Gastroenterology | Admitting: Gastroenterology

## 2024-04-25 ENCOUNTER — Encounter: Payer: Self-pay | Admitting: Gastroenterology

## 2024-04-25 DIAGNOSIS — D124 Benign neoplasm of descending colon: Secondary | ICD-10-CM | POA: Diagnosis not present

## 2024-04-25 DIAGNOSIS — E119 Type 2 diabetes mellitus without complications: Secondary | ICD-10-CM | POA: Insufficient documentation

## 2024-04-25 DIAGNOSIS — Z9049 Acquired absence of other specified parts of digestive tract: Secondary | ICD-10-CM | POA: Insufficient documentation

## 2024-04-25 DIAGNOSIS — Z7985 Long-term (current) use of injectable non-insulin antidiabetic drugs: Secondary | ICD-10-CM | POA: Diagnosis not present

## 2024-04-25 DIAGNOSIS — K219 Gastro-esophageal reflux disease without esophagitis: Secondary | ICD-10-CM | POA: Diagnosis present

## 2024-04-25 DIAGNOSIS — K635 Polyp of colon: Secondary | ICD-10-CM | POA: Diagnosis not present

## 2024-04-25 DIAGNOSIS — Z87891 Personal history of nicotine dependence: Secondary | ICD-10-CM | POA: Insufficient documentation

## 2024-04-25 DIAGNOSIS — D122 Benign neoplasm of ascending colon: Secondary | ICD-10-CM | POA: Diagnosis not present

## 2024-04-25 DIAGNOSIS — Z800 Family history of malignant neoplasm of digestive organs: Secondary | ICD-10-CM | POA: Diagnosis not present

## 2024-04-25 DIAGNOSIS — K2289 Other specified disease of esophagus: Secondary | ICD-10-CM | POA: Diagnosis not present

## 2024-04-25 DIAGNOSIS — Z7984 Long term (current) use of oral hypoglycemic drugs: Secondary | ICD-10-CM | POA: Diagnosis not present

## 2024-04-25 DIAGNOSIS — G4730 Sleep apnea, unspecified: Secondary | ICD-10-CM | POA: Diagnosis not present

## 2024-04-25 HISTORY — PX: POLYPECTOMY: SHX149

## 2024-04-25 HISTORY — DX: Dyspnea, unspecified: R06.00

## 2024-04-25 HISTORY — PX: ESOPHAGOGASTRODUODENOSCOPY: SHX5428

## 2024-04-25 HISTORY — PX: COLONOSCOPY: SHX5424

## 2024-04-25 LAB — GLUCOSE, CAPILLARY: Glucose-Capillary: 126 mg/dL — ABNORMAL HIGH (ref 70–99)

## 2024-04-25 SURGERY — COLONOSCOPY
Anesthesia: General

## 2024-04-25 MED ORDER — SODIUM CHLORIDE 0.9 % IV SOLN: INTRAVENOUS | Status: DC

## 2024-04-25 MED ORDER — LIDOCAINE HCL (CARDIAC) PF 100 MG/5ML IV SOSY
PREFILLED_SYRINGE | INTRAVENOUS | Status: DC | PRN
  Administered 2024-04-25: 80 mg via INTRAVENOUS

## 2024-04-25 MED ORDER — GLYCOPYRROLATE 0.2 MG/ML IJ SOLN
INTRAMUSCULAR | Status: AC
  Filled 2024-04-25: qty 1

## 2024-04-25 MED ORDER — PHENYLEPHRINE 80 MCG/ML (10ML) SYRINGE FOR IV PUSH (FOR BLOOD PRESSURE SUPPORT)
PREFILLED_SYRINGE | INTRAVENOUS | Status: AC
Start: 2024-04-25 — End: 2024-04-25
  Filled 2024-04-25: qty 10

## 2024-04-25 MED ORDER — GLYCOPYRROLATE 0.2 MG/ML IJ SOLN
INTRAMUSCULAR | Status: DC | PRN
  Administered 2024-04-25: .2 mg via INTRAVENOUS

## 2024-04-25 MED ORDER — PHENYLEPHRINE 80 MCG/ML (10ML) SYRINGE FOR IV PUSH (FOR BLOOD PRESSURE SUPPORT)
PREFILLED_SYRINGE | INTRAVENOUS | Status: DC | PRN
  Administered 2024-04-25: 160 ug via INTRAVENOUS

## 2024-04-25 MED ORDER — PROPOFOL 500 MG/50ML IV EMUL
INTRAVENOUS | Status: DC | PRN
  Administered 2024-04-25: 75 ug/kg/min via INTRAVENOUS

## 2024-04-25 MED ORDER — LIDOCAINE HCL (PF) 2 % IJ SOLN
INTRAMUSCULAR | Status: AC
  Filled 2024-04-25: qty 5

## 2024-04-25 MED ORDER — PROPOFOL 10 MG/ML IV BOLUS
INTRAVENOUS | Status: DC | PRN
  Administered 2024-04-25 (×2): 50 mg via INTRAVENOUS

## 2024-04-25 MED ORDER — DEXMEDETOMIDINE HCL IN NACL 80 MCG/20ML IV SOLN
INTRAVENOUS | Status: DC | PRN
  Administered 2024-04-25: 20 ug via INTRAVENOUS

## 2024-04-25 MED ORDER — PROPOFOL 1000 MG/100ML IV EMUL
INTRAVENOUS | Status: AC
  Filled 2024-04-25: qty 100

## 2024-04-25 NOTE — Anesthesia Postprocedure Evaluation (Signed)
 Anesthesia Post Note  Patient: Gregory Johnston  Procedure(s) Performed: COLONOSCOPY EGD (ESOPHAGOGASTRODUODENOSCOPY) POLYPECTOMY, INTESTINE  Patient location during evaluation: Endoscopy Anesthesia Type: General Level of consciousness: awake and alert Pain management: pain level controlled Vital Signs Assessment: post-procedure vital signs reviewed and stable Respiratory status: spontaneous breathing, nonlabored ventilation and respiratory function stable Cardiovascular status: blood pressure returned to baseline and stable Postop Assessment: no apparent nausea or vomiting Anesthetic complications: no   No notable events documented.   Last Vitals:  Vitals:   04/25/24 1008 04/25/24 1114  BP: (!) 145/85 100/60  Pulse: 80 68  Resp: 18 19  Temp: (!) 36 C (!) 36 C  SpO2: 99%     Last Pain:  Vitals:   04/25/24 1119  TempSrc:   PainSc: 0-No pain                 Fairy POUR Beauden [place]

## 2024-04-25 NOTE — Transfer of Care (Signed)
 Immediate Anesthesia Transfer of Care Note  Patient: Gregory Johnston  Procedure(s) Performed: COLONOSCOPY EGD (ESOPHAGOGASTRODUODENOSCOPY) POLYPECTOMY, INTESTINE  Patient Location: PACU  Anesthesia Type:General  Level of Consciousness: sedated  Airway & Oxygen Therapy: Patient Spontanous Breathing  Post-op Assessment: Report given to RN and Post -op Vital signs reviewed and stable  Post vital signs: Reviewed and stable  Last Vitals:  Vitals Value Taken Time  BP    Temp    Pulse    Resp    SpO2      Last Pain:  Vitals:   04/25/24 1008  TempSrc: Temporal  PainSc: 0-No pain         Complications: No notable events documented.

## 2024-04-25 NOTE — Anesthesia Preprocedure Evaluation (Signed)
 Anesthesia Evaluation  Patient identified by MRN, date of birth, ID band Patient awake    Reviewed: Allergy & Precautions, NPO status , Patient's Chart, lab work & pertinent test results  History of Anesthesia Complications Negative for: history of anesthetic complications  Airway Mallampati: III  TM Distance: <3 FB Neck ROM: full    Dental  (+) Chipped   Pulmonary sleep apnea , former smoker   Pulmonary exam normal        Cardiovascular Exercise Tolerance: Good (-) angina (-) Past MI negative cardio ROS Normal cardiovascular exam     Neuro/Psych negative neurological ROS  negative psych ROS   GI/Hepatic Neg liver ROS,GERD  Controlled,,  Endo/Other  diabetes, Type 2    Renal/GU negative Renal ROS  negative genitourinary   Musculoskeletal   Abdominal   Peds  Hematology negative hematology ROS (+)   Anesthesia Other Findings Past Medical History: No date: Diabetes mellitus without complication (HCC) No date: Dyspnea No date: Sleep apnea  Past Surgical History: No date: CHOLECYSTECTOMY 04/18/2024: COLONOSCOPY; N/A     Comment:  Procedure: COLONOSCOPY;  Surgeon: Onita Elspeth Sharper,              DO;  Location: [facility] ENDOSCOPY;  Service:               Gastroenterology;  Laterality: N/A;  DM on OZEMPIC 04/18/2024: ESOPHAGOGASTRODUODENOSCOPY; N/A     Comment:  Procedure: EGD (ESOPHAGOGASTRODUODENOSCOPY);  Surgeon:               Onita Elspeth Sharper, DO;  Location: [facility] ENDOSCOPY;                Service: Gastroenterology;  Laterality: N/A;  BMI    Body Mass Index: 30.65 kg/m      Reproductive/Obstetrics negative OB ROS                              Anesthesia Physical Anesthesia Plan  ASA: 3  Anesthesia Plan: General   Post-op Pain Management:    Induction: Intravenous  PONV Risk Score and Plan: Propofol infusion and TIVA  Airway Management Planned: Natural Airway and  Nasal Cannula  Additional Equipment:   Intra-op Plan:   Post-operative Plan:   Informed Consent: I have reviewed the patients History and Physical, chart, labs and discussed the procedure including the risks, benefits and alternatives for the proposed anesthesia with the patient or authorized representative who has indicated his/her understanding and acceptance.     Dental Advisory Given  Plan Discussed with: Anesthesiologist, CRNA and Surgeon  Anesthesia Plan Comments: (Patient consented for risks of anesthesia including but not limited to:  - adverse reactions to medications - risk of airway placement if required - damage to eyes, teeth, lips or other oral mucosa - nerve damage due to positioning  - sore throat or hoarseness - Damage to heart, brain, nerves, lungs, other parts of body or loss of life  Patient voiced understanding and assent.)        Anesthesia Quick Evaluation

## 2024-04-25 NOTE — Op Note (Signed)
 [facility] Gastroenterology Patient Name: Gregory Johnston Procedure Date: 04/25/2024 10:20 AM MRN: [id number] Account #: [id number] Date of Birth: Aug 26, 1966 Admit Type: Outpatient Age: 58 Room: [facility] ENDO ROOM 1 Gender: Male Note Status: Finalized Instrument Name: Upper Endoscope [id number] Procedure:             Upper GI endoscopy Indications:           Gastro-esophageal reflux disease Providers:             Elspeth Ozell Jungling DO, DO Referring MD:          Tamra Leventhal, MD (Referring MD) Medicines:             Monitored Anesthesia Care Complications:         No immediate complications. Estimated blood loss:                         Minimal. Procedure:             Pre-Anesthesia Assessment:                        - Prior to the procedure, a History and Physical was                         performed, and patient medications and allergies were                         reviewed. The patient is competent. The risks and                         benefits of the procedure and the sedation options and                         risks were discussed with the patient. All questions                         were answered and informed consent was obtained.                         Patient identification and proposed procedure were                         verified by the physician, the nurse, the anesthetist                         and the technician in the endoscopy suite. Mental                         Status Examination: alert and oriented. Airway                         Examination: normal oropharyngeal airway and neck                         mobility. Respiratory Examination: clear to                         auscultation. CV Examination: RRR, no murmurs, no S3  or S4. Prophylactic Antibiotics: The patient does not                         require prophylactic antibiotics. Prior                         Anticoagulants: The patient has taken no anticoagulant                          or antiplatelet agents. ASA Grade Assessment: III - A                         patient with severe systemic disease. After reviewing                         the risks and benefits, the patient was deemed in                         satisfactory condition to undergo the procedure. The                         anesthesia plan was to use monitored anesthesia care                         (MAC). Immediately prior to administration of                         medications, the patient was re-assessed for adequacy                         to receive sedatives. The heart rate, respiratory                         rate, oxygen saturations, blood pressure, adequacy of                         pulmonary ventilation, and response to care were                         monitored throughout the procedure. The physical                         status of the patient was re-assessed after the                         procedure.                        After obtaining informed consent, the endoscope was                         passed under direct vision. Throughout the procedure,                         the patient's blood pressure, pulse, and oxygen                         saturations were monitored continuously. The Endoscope  was introduced through the mouth, and advanced to the                         second part of duodenum. The upper GI endoscopy was                         accomplished without difficulty. The patient tolerated                         the procedure well. Findings:      The duodenal bulb, first portion of the duodenum and second portion of       the duodenum were normal. Estimated blood loss: none.      The entire examined stomach was normal. Estimated blood loss: none.      The Z-line was irregular. Mucosa was biopsied with a cold forceps for       histology. One specimen bottle was sent to pathology. Estimated blood       loss was minimal.       Esophagogastric landmarks were identified: the gastroesophageal junction       was found at 40 cm from the incisors.      The exam of the esophagus was otherwise normal. Impression:            - Normal duodenal bulb, first portion of the duodenum                         and second portion of the duodenum.                        - Normal stomach.                        - Z-line irregular. Biopsied.                        - Esophagogastric landmarks identified. Recommendation:        - Patient has a contact number available for                         emergencies. The signs and symptoms of potential                         delayed complications were discussed with the patient.                         Return to normal activities tomorrow. Written                         discharge instructions were provided to the patient.                        - Discharge patient to home.                        - Resume previous diet.                        - Continue present medications.                        -  Await pathology results.                        - Repeat upper endoscopy for surveillance based on                         pathology results.                        - Return to referring physician as previously                         scheduled.                        - The findings and recommendations were discussed with                         the patient.                        - The findings and recommendations were discussed with                         the patient's family. Procedure Code(s):     --- Professional ---                        [phone], Esophagogastroduodenoscopy, flexible,                         transoral; with biopsy, single or multiple Diagnosis Code(s):     --- Professional ---                        K22.89, Other specified disease of esophagus                        K21.9, Gastro-esophageal reflux disease without                         esophagitis CPT copyright 2022  American Medical Association. All rights reserved. The codes documented in this report are preliminary and upon coder review may  be revised to meet current compliance requirements. Attending Participation:      I personally performed the entire procedure. Elspeth Jungling, DO Elspeth Ozell Jungling DO, DO 04/25/2024 10:43:14 AM This report has been signed electronically. Number of Addenda: 0 Note Initiated On: 04/25/2024 10:20 AM Estimated Blood Loss:  Estimated blood loss was minimal.      [facility]

## 2024-04-25 NOTE — Op Note (Signed)
 [facility] Gastroenterology Patient Name: Gregory Johnston Procedure Date: 04/25/2024 10:20 AM MRN: [id number] Account #: [id number] Date of Birth: 08-25-1966 Admit Type: Outpatient Age: 58 Room: [facility] ENDO ROOM 1 Gender: Male Note Status: Finalized Instrument Name: Arvis [id number] Procedure:             Colonoscopy Indications:           High risk colon cancer surveillance: Personal history                         of colonic polyps, Family history of colon cancer in a                         first-degree relative before age 42 years Providers:             Elspeth Ozell Onita ROSALEA, DO Referring MD:          Tamra Leventhal, MD (Referring MD) Medicines:             Monitored Anesthesia Care Complications:         No immediate complications. Estimated blood loss:                         Minimal. Procedure:             Pre-Anesthesia Assessment:                        - Prior to the procedure, a History and Physical was                         performed, and patient medications and allergies were                         reviewed. The patient is competent. The risks and                         benefits of the procedure and the sedation options and                         risks were discussed with the patient. All questions                         were answered and informed consent was obtained.                         Patient identification and proposed procedure were                         verified by the physician, the nurse, the anesthetist                         and the technician in the endoscopy suite. Mental                         Status Examination: alert and oriented. Airway                         Examination: normal oropharyngeal airway and neck  mobility. Respiratory Examination: clear to                         auscultation. CV Examination: RRR, no murmurs, no S3                         or S4. Prophylactic Antibiotics: The  patient does not                         require prophylactic antibiotics. Prior                         Anticoagulants: The patient has taken no anticoagulant                         or antiplatelet agents. ASA Grade Assessment: III - A                         patient with severe systemic disease. After reviewing                         the risks and benefits, the patient was deemed in                         satisfactory condition to undergo the procedure. The                         anesthesia plan was to use monitored anesthesia care                         (MAC). Immediately prior to administration of                         medications, the patient was re-assessed for adequacy                         to receive sedatives. The heart rate, respiratory                         rate, oxygen saturations, blood pressure, adequacy of                         pulmonary ventilation, and response to care were                         monitored throughout the procedure. The physical                         status of the patient was re-assessed after the                         procedure.                        After obtaining informed consent, the colonoscope was                         passed under direct vision. Throughout the procedure,  the patient's blood pressure, pulse, and oxygen                         saturations were monitored continuously. The                         Colonoscope was introduced through the anus and                         advanced to the the terminal ileum, with                         identification of the appendiceal orifice and IC                         valve. The colonoscopy was performed without                         difficulty. The patient tolerated the procedure well.                         The quality of the bowel preparation was evaluated                         using the BBPS [facility] Bowel Preparation Scale) with                          scores of: Right Colon = 3 (entire mucosa seen well                         with no residual staining, small fragments of stool or                         opaque liquid), Transverse Colon = 3 (entire mucosa                         seen well with no residual staining, small fragments                         of stool or opaque liquid) and Left Colon = 2 (minor                         amount of residual staining, small fragments of stool                         and/or opaque liquid, but mucosa seen well). The total                         BBPS score equals 8. The quality of the bowel                         preparation was excellent. The terminal ileum,                         ileocecal valve, appendiceal orifice, and rectum were  photographed. Findings:      The perianal and digital rectal examinations were normal. Pertinent       negatives include normal sphincter tone.      The terminal ileum appeared normal. Estimated blood loss: none.      Retroflexion in the right colon was performed.      Four sessile polyps were found in the sigmoid colon (1), descending       colon (1) and ascending colon (2). The polyps were 1 to 2 mm in size.       These polyps were removed with a jumbo cold forceps. Resection and       retrieval were complete. Estimated blood loss was minimal.      The exam was otherwise without abnormality on direct and retroflexion       views. Impression:            - The examined portion of the ileum was normal.                        - Four 1 to 2 mm polyps in the sigmoid colon, in the                         descending colon and in the ascending colon, removed                         with a jumbo cold forceps. Resected and retrieved.                        - The examination was otherwise normal on direct and                         retroflexion views. Recommendation:        - Patient has a contact number available for                          emergencies. The signs and symptoms of potential                         delayed complications were discussed with the patient.                         Return to normal activities tomorrow. Written                         discharge instructions were provided to the patient.                        - Discharge patient to home.                        - Resume previous diet.                        - Continue present medications.                        - Await pathology results.                        - Repeat colonoscopy for surveillance based  on                         pathology results.                        - Return to referring physician as previously                         scheduled.                        - The findings and recommendations were discussed with                         the patient. Procedure Code(s):     --- Professional ---                        [phone], Colonoscopy, flexible; with biopsy, single or                         multiple Diagnosis Code(s):     --- Professional ---                        Z86.010, Personal history of colonic polyps                        D12.5, Benign neoplasm of sigmoid colon                        D12.4, Benign neoplasm of descending colon                        D12.2, Benign neoplasm of ascending colon                        Z80.0, Family history of malignant neoplasm of                         digestive organs CPT copyright 2022 American Medical Association. All rights reserved. The codes documented in this report are preliminary and upon coder review may  be revised to meet current compliance requirements. Attending Participation:      I personally performed the entire procedure. Elspeth Jungling, DO Elspeth Ozell Jungling DO, DO 04/25/2024 11:18:08 AM This report has been signed electronically. Number of Addenda: 0 Note Initiated On: 04/25/2024 10:20 AM Scope Withdrawal Time: 0 hours 18 minutes 4 seconds  Total Procedure Duration: 0 hours  20 minutes 41 seconds  Estimated Blood Loss:  Estimated blood loss was minimal.      [facility]

## 2024-04-25 NOTE — Interval H&P Note (Signed)
 History and Physical Interval Note: Preprocedure H&P from 04/25/24  was reviewed and there was no interval change after seeing and examining the patient.  Written consent was obtained from the patient after discussion of risks, benefits, and alternatives. Patient has consented to proceed with Esophagogastroduodenoscopy and Colonoscopy with possible intervention   04/25/2024 10:29 AM  Gregory Johnston  has presented today for surgery, with the diagnosis of Gastroesophageal reflux disease, unspecified whether esophagitis present (K21.9) History of colon polyps (Z86.0100).  The various methods of treatment have been discussed with the patient and family. After consideration of risks, benefits and other options for treatment, the patient has consented to  Procedure(s): COLONOSCOPY (N/A) EGD (ESOPHAGOGASTRODUODENOSCOPY) (N/A) as a surgical intervention.  The patient's history has been reviewed, patient examined, no change in status, stable for surgery.  I have reviewed the patient's chart and labs.  Questions were answered to the patient's satisfaction.     Elspeth Ozell Jungling

## 2024-04-25 NOTE — H&P (Signed)
 Pre-Procedure H&P   Patient ID: Gregory Johnston is a 58 y.o. male.  Gastroenterology Provider: Elspeth Ozell Jungling, DO  Referring Provider: Romero Antigua, PA PCP: Sadie Manna, MD  Date: 04/25/2024  HPI Gregory Johnston is a 57 y.o. male who presents today for Esophagogastroduodenoscopy and Colonoscopy for gerd, personal history of colon polyps.  Patient with worsening reflux and required increasing PPI dosing.  Symptoms have resolved with increased dosage.  No dysphagia or odynophagia.  Having daily bowel meant without melena hematochezia diarrhea or constipation  Creatinine 1.2 hemoglobin 14.7 MCV 89 platelets 240,000  Last colonoscopy in November 2019 with 1 adenomatous polyp in the sigmoid colon.  Internal hemorrhoids also noted  Status post cholecystectomy  Family history of colon cancer- sister  Ozempic held for procedure (3 weeks)   Past Medical History:  Diagnosis Date   Diabetes mellitus without complication (HCC)    Dyspnea    Sleep apnea     Past Surgical History:  Procedure Laterality Date   CHOLECYSTECTOMY     COLONOSCOPY N/A 04/18/2024   Procedure: COLONOSCOPY;  Surgeon: Jungling Elspeth Ozell, DO;  Location: [facility] ENDOSCOPY;  Service: Gastroenterology;  Laterality: N/A;  DM on OZEMPIC   ESOPHAGOGASTRODUODENOSCOPY N/A 04/18/2024   Procedure: EGD (ESOPHAGOGASTRODUODENOSCOPY);  Surgeon: Jungling Elspeth Ozell, DO;  Location: [facility] ENDOSCOPY;  Service: Gastroenterology;  Laterality: N/A;    Family History Fhx- crc- sister No other h/o GI disease or malignancy  Review of Systems  Constitutional:  Negative for activity change, appetite change, chills, diaphoresis, fatigue, fever and unexpected weight change.  HENT:  Negative for trouble swallowing and voice change.   Respiratory:  Negative for shortness of breath and wheezing.   Cardiovascular:  Negative for chest pain, palpitations and leg swelling.  Gastrointestinal:  Negative for abdominal  distention, abdominal pain, anal bleeding, blood in stool, constipation, diarrhea, nausea and vomiting.  Musculoskeletal:  Negative for arthralgias and myalgias.  Skin:  Negative for color change and pallor.  Neurological:  Negative for dizziness, syncope and weakness.  Psychiatric/Behavioral:  Negative for confusion. The patient is not nervous/anxious.   All other systems reviewed and are negative.    Medications No current facility-administered medications on file prior to encounter.   Current Outpatient Medications on File Prior to Encounter  Medication Sig Dispense Refill   acetaminophen  (TYLENOL ) 325 MG tablet Take 2 tablets (650 mg total) by mouth every 6 (six) hours as needed for mild pain or headache (fever >/= 101).     albuterol  (VENTOLIN  HFA) 108 (90 Base) MCG/ACT inhaler Inhale 2 puffs into the lungs every 6 (six) hours as needed for wheezing or shortness of breath. 8 g 0   glimepiride  (AMARYL ) 1 MG tablet Take 2 mg by mouth daily with breakfast.     meloxicam  (MOBIC ) 15 MG tablet Take 15 mg by mouth daily.     metFORMIN (GLUCOPHAGE) 1000 MG tablet Take 1,000 mg by mouth 2 (two) times daily with a meal.     omeprazole (PRILOSEC) 20 MG capsule Take 20 mg by mouth daily.     Semaglutide, 1 MG/DOSE, (OZEMPIC, 1 MG/DOSE,) 4 MG/3ML SOPN Inject 1 mg into the skin once a week.     sildenafil (VIAGRA) 100 MG tablet PLEASE SEE ATTACHED FOR DETAILED DIRECTIONS     Blood Glucose Monitoring Suppl (ONE TOUCH ULTRA 2) w/Device KIT 2 (two) times daily. as directed     Cyanocobalamin (B-12 PO) Take 1,000 mcg by mouth daily.     FLOVENT   HFA 44 MCG/ACT inhaler SMARTSIG:2 Inhalation Via Inhaler Twice Daily (Patient not taking: Reported on 04/25/2024)     gabapentin  (NEURONTIN ) 100 MG capsule Take 100 mg by mouth daily. (Patient not taking: Reported on 04/25/2024)     Lancets (ONETOUCH DELICA PLUS LANCET33G) MISC      NEOMYCIN -POLYMYXIN-HYDROCORTISONE (CORTISPORIN) 1 % SOLN OTIC solution Apply 1-2  drops to toe BID after soaking 10 mL 1   ONETOUCH ULTRA test strip USE TO TEST TWICE DAILY      Pertinent medications related to GI and procedure were reviewed by me with the patient prior to the procedure   Current Facility-Administered Medications:    0.9 %  sodium chloride  infusion, , Intravenous, Continuous, Onita Elspeth Sharper, DO  sodium chloride          No Known Allergies Allergies were reviewed by me prior to the procedure  Objective   Body mass index is 30.65 kg/m. Vitals:   04/25/24 1008  BP: (!) 145/85  Pulse: 80  Resp: 18  Temp: (!) 96.8 F (36 C)  TempSrc: Temporal  SpO2: 99%  Weight: 91.4 kg  Height: 5' 8 (1.727 m)     Physical Exam Vitals and nursing note reviewed.  Constitutional:      General: He is not in acute distress.    Appearance: Normal appearance. He is not ill-appearing, toxic-appearing or diaphoretic.  HENT:     Head: Normocephalic and atraumatic.     Nose: Nose normal.     Mouth/Throat:     Mouth: Mucous membranes are moist.     Pharynx: Oropharynx is clear.  Eyes:     General: No scleral icterus.    Extraocular Movements: Extraocular movements intact.  Cardiovascular:     Rate and Rhythm: Normal rate and regular rhythm.     Heart sounds: Murmur heard.  Pulmonary:     Effort: Pulmonary effort is normal. No respiratory distress.     Breath sounds: Normal breath sounds. No wheezing, rhonchi or rales.  Abdominal:     General: Abdomen is flat. Bowel sounds are normal. There is no distension.     Palpations: Abdomen is soft.     Tenderness: There is no abdominal tenderness. There is no guarding or rebound.  Musculoskeletal:     Cervical back: Neck supple.     Right lower leg: No edema.     Left lower leg: No edema.  Skin:    General: Skin is warm and dry.     Coloration: Skin is not jaundiced or pale.  Neurological:     General: No focal deficit present.     Mental Status: He is alert and oriented to person, place, and time.  Mental status is at baseline.  Psychiatric:        Mood and Affect: Mood normal.        Behavior: Behavior normal.        Thought Content: Thought content normal.        Judgment: Judgment normal.      Assessment:  Gregory Johnston is a 58 y.o. male  who presents today for Esophagogastroduodenoscopy and Colonoscopy for gerd, personal history of colon polyps .  Plan:  Esophagogastroduodenoscopy and Colonoscopy with possible intervention today  Esophagogastroduodenoscopy and Colonoscopy with possible biopsy, control of bleeding, polypectomy, and interventions as necessary has been discussed with the patient/patient representative. Informed consent was obtained from the patient/patient representative after explaining the indication, nature, and risks of the procedure including but not limited to  death, bleeding, perforation, missed neoplasm/lesions, cardiorespiratory compromise, and reaction to medications. Opportunity for questions was given and appropriate answers were provided. Patient/patient representative has verbalized understanding is amenable to undergoing the procedure.   Elspeth Ozell Jungling, DO  [facility]  Portions of the record may have been created with voice recognition software. Occasional wrong-word or 'sound-a-like' substitutions may have occurred due to the inherent limitations of voice recognition software.  Read the chart carefully and recognize, using context, where substitutions may have occurred.

## 2024-04-28 LAB — SURGICAL PATHOLOGY
